# Patient Record
Sex: Male | Born: 1955 | Race: White | Hispanic: No | Marital: Single | Smoking: Current every day smoker
Health system: Southern US, Community
[De-identification: ages and names within clinical notes are randomized; demographics above are authoritative.]

## PROBLEM LIST (undated history)

## (undated) DIAGNOSIS — F329 Major depressive disorder, single episode, unspecified: Secondary | ICD-10-CM

## (undated) DIAGNOSIS — E119 Type 2 diabetes mellitus without complications: Secondary | ICD-10-CM

## (undated) DIAGNOSIS — F32A Depression, unspecified: Secondary | ICD-10-CM

## (undated) DIAGNOSIS — J449 Chronic obstructive pulmonary disease, unspecified: Secondary | ICD-10-CM

## (undated) DIAGNOSIS — J45909 Unspecified asthma, uncomplicated: Secondary | ICD-10-CM

## (undated) DIAGNOSIS — F419 Anxiety disorder, unspecified: Secondary | ICD-10-CM

## (undated) DIAGNOSIS — I10 Essential (primary) hypertension: Secondary | ICD-10-CM

## (undated) HISTORY — DX: Chronic obstructive pulmonary disease, unspecified: J44.9

## (undated) HISTORY — DX: Anxiety disorder, unspecified: F41.9

## (undated) HISTORY — DX: Type 2 diabetes mellitus without complications: E11.9

## (undated) HISTORY — DX: Unspecified asthma, uncomplicated: J45.909

## (undated) HISTORY — DX: Essential (primary) hypertension: I10

## (undated) HISTORY — DX: Depression, unspecified: F32.A

## (undated) HISTORY — PX: FRACTURE SURGERY: SHX138

---
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
## 1990-08-12 HISTORY — PX: CARDIAC CATHETERIZATION: SHX172

## 1998-03-03 ENCOUNTER — Ambulatory Visit (HOSPITAL_COMMUNITY): Admission: RE | Admit: 1998-03-03 | Discharge: 1998-03-03 | Payer: Self-pay | Admitting: *Deleted

## 2004-07-12 ENCOUNTER — Ambulatory Visit: Payer: Self-pay | Admitting: Internal Medicine

## 2004-07-14 ENCOUNTER — Inpatient Hospital Stay (HOSPITAL_COMMUNITY): Admission: AC | Admit: 2004-07-14 | Discharge: 2004-08-15 | Payer: Self-pay

## 2004-08-08 ENCOUNTER — Ambulatory Visit: Payer: Self-pay | Admitting: Physical Medicine & Rehabilitation

## 2004-08-15 ENCOUNTER — Inpatient Hospital Stay (HOSPITAL_COMMUNITY)
Admission: RE | Admit: 2004-08-15 | Discharge: 2004-08-27 | Payer: Self-pay | Admitting: Physical Medicine & Rehabilitation

## 2004-08-31 ENCOUNTER — Ambulatory Visit: Payer: Self-pay | Admitting: Infectious Diseases

## 2004-08-31 ENCOUNTER — Inpatient Hospital Stay (HOSPITAL_COMMUNITY): Admission: EM | Admit: 2004-08-31 | Discharge: 2004-09-03 | Payer: Self-pay | Admitting: Emergency Medicine

## 2004-10-02 ENCOUNTER — Emergency Department (HOSPITAL_COMMUNITY): Admission: EM | Admit: 2004-10-02 | Discharge: 2004-10-02 | Payer: Self-pay | Admitting: Emergency Medicine

## 2004-10-08 ENCOUNTER — Encounter
Admission: RE | Admit: 2004-10-08 | Discharge: 2005-01-06 | Payer: Self-pay | Admitting: Physical Medicine & Rehabilitation
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
## 2012-03-17 ENCOUNTER — Ambulatory Visit: Payer: Self-pay

## 2019-04-28 ENCOUNTER — Encounter: Payer: Self-pay | Admitting: Physician Assistant

## 2019-04-28 ENCOUNTER — Other Ambulatory Visit: Payer: Self-pay

## 2019-04-28 ENCOUNTER — Ambulatory Visit: Payer: Medicaid Other | Admitting: Physician Assistant

## 2019-04-28 VITALS — BP 120/84 | HR 92 | Temp 96.9°F | Resp 16 | Ht 66.0 in | Wt 201.4 lb

## 2019-04-28 DIAGNOSIS — E1169 Type 2 diabetes mellitus with other specified complication: Secondary | ICD-10-CM | POA: Diagnosis not present

## 2019-04-28 DIAGNOSIS — E119 Type 2 diabetes mellitus without complications: Secondary | ICD-10-CM | POA: Diagnosis not present

## 2019-04-28 DIAGNOSIS — F209 Schizophrenia, unspecified: Secondary | ICD-10-CM | POA: Diagnosis not present

## 2019-04-28 DIAGNOSIS — Z23 Encounter for immunization: Secondary | ICD-10-CM

## 2019-04-28 DIAGNOSIS — Z1211 Encounter for screening for malignant neoplasm of colon: Secondary | ICD-10-CM

## 2019-04-28 DIAGNOSIS — E785 Hyperlipidemia, unspecified: Secondary | ICD-10-CM

## 2019-04-28 DIAGNOSIS — I152 Hypertension secondary to endocrine disorders: Secondary | ICD-10-CM | POA: Insufficient documentation

## 2019-04-28 DIAGNOSIS — Z720 Tobacco use: Secondary | ICD-10-CM | POA: Diagnosis not present

## 2019-04-28 DIAGNOSIS — I10 Essential (primary) hypertension: Secondary | ICD-10-CM

## 2019-04-28 DIAGNOSIS — E1165 Type 2 diabetes mellitus with hyperglycemia: Secondary | ICD-10-CM | POA: Insufficient documentation

## 2019-04-28 DIAGNOSIS — E1159 Type 2 diabetes mellitus with other circulatory complications: Secondary | ICD-10-CM | POA: Insufficient documentation

## 2019-04-28 MED ORDER — METFORMIN HCL ER 500 MG PO TB24: 1000.0000 mg | ORAL_TABLET | Freq: Every day | ORAL | 0 refills | Status: DC

## 2019-04-28 MED ORDER — JANUMET XR 100-1000 MG PO TB24: 1.0000 | ORAL_TABLET | Freq: Every day | ORAL | 1 refills | Status: DC

## 2019-04-28 MED ORDER — METOPROLOL SUCCINATE 50 MG PO CS24: 1.0000 | EXTENDED_RELEASE_CAPSULE | Freq: Every day | ORAL | 1 refills | Status: DC
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
## 2019-04-28 NOTE — Patient Instructions (Signed)
Diabetes Mellitus and Exercise Exercising regularly is important for your overall health, especially when you have diabetes (diabetes mellitus). Exercising is not only about losing weight. It has many other health benefits, such as increasing muscle strength and bone density and reducing body fat and stress. This leads to improved fitness, flexibility, and endurance, all of which result in better overall health. Exercise has additional benefits for people with diabetes, including:  Reducing appetite.  Helping to lower and control blood glucose.  Lowering blood pressure.  Helping to control amounts of fatty substances (lipids) in the blood, such as cholesterol and triglycerides.  Helping the body to respond better to insulin (improving insulin sensitivity).  Reducing how much insulin the body needs.  Decreasing the risk for heart disease by: ? Lowering cholesterol and triglyceride levels. ? Increasing the levels of good cholesterol. ? Lowering blood glucose levels. What is my activity plan? Your health care provider or certified diabetes educator can help you make a plan for the type and frequency of exercise (activity plan) that works for you. Make sure that you:  Do at least 150 minutes of moderate-intensity or vigorous-intensity exercise each week. This could be brisk walking, biking, or water aerobics. ? Do stretching and strength exercises, such as yoga or weightlifting, at least 2 times a week. ? Spread out your activity over at least 3 days of the week.  Get some form of physical activity every day. ? Do not go more than 2 days in a row without some kind of physical activity. ? Avoid being inactive for more than 30 minutes at a time. Take frequent breaks to walk or stretch.  Choose a type of exercise or activity that you enjoy, and set realistic goals.  Start slowly, and gradually increase the intensity of your exercise over time. What do I need to know about managing my  diabetes?   Check your blood glucose before and after exercising. ? If your blood glucose is 240 mg/dL (13.3 mmol/L) or higher before you exercise, check your urine for ketones. If you have ketones in your urine, do not exercise until your blood glucose returns to normal. ? If your blood glucose is 100 mg/dL (5.6 mmol/L) or lower, eat a snack containing 15-20 grams of carbohydrate. Check your blood glucose 15 minutes after the snack to make sure that your level is above 100 mg/dL (5.6 mmol/L) before you start your exercise.  Know the symptoms of low blood glucose (hypoglycemia) and how to treat it. Your risk for hypoglycemia increases during and after exercise. Common symptoms of hypoglycemia can include: ? Hunger. ? Anxiety. ? Sweating and feeling clammy. ? Confusion. ? Dizziness or feeling light-headed. ? Increased heart rate or palpitations. ? Blurry vision. ? Tingling or numbness around the mouth, lips, or tongue. ? Tremors or shakes. ? Irritability.  Keep a rapid-acting carbohydrate snack available before, during, and after exercise to help prevent or treat hypoglycemia.  Avoid injecting insulin into areas of the body that are going to be exercised. For example, avoid injecting insulin into: ? The arms, when playing tennis. ? The legs, when jogging.  Keep records of your exercise habits. Doing this can help you and your health care provider adjust your diabetes management plan as needed. Write down: ? Food that you eat before and after you exercise. ? Blood glucose levels before and after you exercise. ? The type and amount of exercise you have done. ? When your insulin is expected to peak, if you use   insulin. Avoid exercising at times when your insulin is peaking.  When you start a new exercise or activity, work with your health care provider to make sure the activity is safe for you, and to adjust your insulin, medicines, or food intake as needed.  Drink plenty of water while  you exercise to prevent dehydration or heat stroke. Drink enough fluid to keep your urine clear or pale yellow. Summary  Exercising regularly is important for your overall health, especially when you have diabetes (diabetes mellitus).  Exercising has many health benefits, such as increasing muscle strength and bone density and reducing body fat and stress.  Your health care provider or certified diabetes educator can help you make a plan for the type and frequency of exercise (activity plan) that works for you.  When you start a new exercise or activity, work with your health care provider to make sure the activity is safe for you, and to adjust your insulin, medicines, or food intake as needed. This information is not intended to replace advice given to you by your health care provider. Make sure you discuss any questions you have with your health care provider. Document Released: 10/19/2003 Document Revised: 02/20/2017 Document Reviewed: 01/08/2016 Elsevier Patient Education  2020 Elsevier Inc.  

## 2019-04-29 ENCOUNTER — Telehealth: Payer: Self-pay

## 2019-04-29 ENCOUNTER — Telehealth: Payer: Self-pay | Admitting: *Deleted

## 2019-04-29 DIAGNOSIS — Z87891 Personal history of nicotine dependence: Secondary | ICD-10-CM

## 2019-04-29 DIAGNOSIS — Z122 Encounter for screening for malignant neoplasm of respiratory organs: Secondary | ICD-10-CM

## 2019-04-29 LAB — COMPREHENSIVE METABOLIC PANEL
ALT: 18 IU/L (ref 0–44)
AST: 20 IU/L (ref 0–40)
Albumin/Globulin Ratio: 1.5 (ref 1.2–2.2)
Albumin: 4.1 g/dL (ref 3.8–4.8)
Alkaline Phosphatase: 83 IU/L (ref 39–117)
BUN/Creatinine Ratio: 14 (ref 10–24)
BUN: 17 mg/dL (ref 8–27)
Bilirubin Total: 0.2 mg/dL (ref 0.0–1.2)
CO2: 22 mmol/L (ref 20–29)
Calcium: 9.5 mg/dL (ref 8.6–10.2)
Chloride: 101 mmol/L (ref 96–106)
Creatinine, Ser: 1.18 mg/dL (ref 0.76–1.27)
GFR calc Af Amer: 77 mL/min/{1.73_m2} (ref >59)
GFR calc non Af Amer: 66 mL/min/{1.73_m2} (ref >59)
Globulin, Total: 2.8 g/dL (ref 1.5–4.5)
Glucose: 110 mg/dL — ABNORMAL HIGH (ref 65–99)
Potassium: 5 mmol/L (ref 3.5–5.2)
Sodium: 137 mmol/L (ref 134–144)
Total Protein: 6.9 g/dL (ref 6.0–8.5)

## 2019-04-29 LAB — LIPID PANEL
Chol/HDL Ratio: 4.6 ratio (ref 0.0–5.0)
Cholesterol, Total: 138 mg/dL (ref 100–199)
HDL: 30 mg/dL — ABNORMAL LOW (ref >39)
LDL Chol Calc (NIH): 83 mg/dL (ref 0–99)
Triglycerides: 138 mg/dL (ref 0–149)
VLDL Cholesterol Cal: 25 mg/dL (ref 5–40)

## 2019-04-29 LAB — CBC WITH DIFFERENTIAL/PLATELET
Basophils Absolute: 0.1 10*3/uL (ref 0.0–0.2)
Basos: 1 %
EOS (ABSOLUTE): 0.1 10*3/uL (ref 0.0–0.4)
Eos: 2 %
Hematocrit: 44.7 % (ref 37.5–51.0)
Hemoglobin: 15.1 g/dL (ref 13.0–17.7)
Immature Grans (Abs): 0.1 10*3/uL (ref 0.0–0.1)
Immature Granulocytes: 1 %
Lymphocytes Absolute: 2.8 10*3/uL (ref 0.7–3.1)
Lymphs: 32 %
MCH: 30.9 pg (ref 26.6–33.0)
MCHC: 33.8 g/dL (ref 31.5–35.7)
MCV: 91 fL (ref 79–97)
Monocytes Absolute: 0.8 10*3/uL (ref 0.1–0.9)
Monocytes: 9 %
Neutrophils Absolute: 5 10*3/uL (ref 1.4–7.0)
Neutrophils: 55 %
Platelets: 284 10*3/uL (ref 150–450)
RBC: 4.89 x10E6/uL (ref 4.14–5.80)
RDW: 13.2 % (ref 11.6–15.4)
WBC: 8.8 10*3/uL (ref 3.4–10.8)

## 2019-04-29 LAB — MICROALBUMIN / CREATININE URINE RATIO
Creatinine, Urine: 103.3 mg/dL
Microalb/Creat Ratio: 7 mg/g creat (ref 0–29)
Microalbumin, Urine: 7.1 ug/mL

## 2019-04-29 LAB — HEMOGLOBIN A1C
Est. average glucose Bld gHb Est-mCnc: 160 mg/dL
Hgb A1c MFr Bld: 7.2 % — ABNORMAL HIGH (ref 4.8–5.6)

## 2019-04-29 NOTE — Telephone Encounter (Signed)
Received referral for initial lung cancer screening scan. Contacted patient and obtained smoking history,(current, 92 pack year) as well as answering questions related to screening process. Patient denies signs of lung cancer such as weight loss or hemoptysis. Patient denies comorbidity that would prevent curative treatment if lung cancer were found. Patient is scheduled for shared decision making visit and CT scan on 05/13/19 at 130pm.

## 2019-04-29 NOTE — Telephone Encounter (Signed)
Mrs. Luis Woods aware of lab results.

## 2019-04-29 NOTE — Telephone Encounter (Signed)
-----   Message from Trinna Post, [place] sent at 04/29/2019  1:23 PM EDT ----- A1c at 7.2 is just a touch high. Please watch sugar intake and we will check next visit before adjusting medications. CBC normal. Cholesterol just slightly above where it should be for somebody with diabetes. We can recheck next time as well. Please call brother ron or sister judy with results, they should be on DPR.

## 2019-05-12 ENCOUNTER — Other Ambulatory Visit: Payer: Self-pay | Admitting: Physician Assistant

## 2019-05-12 DIAGNOSIS — F259 Schizoaffective disorder, unspecified: Secondary | ICD-10-CM | POA: Insufficient documentation

## 2019-05-12 NOTE — Progress Notes (Signed)
Records received

## 2019-05-13 ENCOUNTER — Ambulatory Visit: Payer: Medicaid Other

## 2019-05-13 ENCOUNTER — Ambulatory Visit: Payer: Medicaid Other | Admitting: Oncology

## 2019-05-20 ENCOUNTER — Other Ambulatory Visit: Payer: Self-pay | Admitting: Physician Assistant

## 2019-05-26 ENCOUNTER — Ambulatory Visit: Payer: Medicaid Other

## 2019-05-27 ENCOUNTER — Ambulatory Visit: Payer: Medicaid Other

## 2019-05-31 ENCOUNTER — Encounter: Payer: Self-pay | Admitting: Nurse Practitioner

## 2019-06-01 ENCOUNTER — Other Ambulatory Visit: Payer: Self-pay

## 2019-06-01 ENCOUNTER — Inpatient Hospital Stay: Payer: Medicaid Other | Attending: Nurse Practitioner | Admitting: Oncology

## 2019-06-01 ENCOUNTER — Ambulatory Visit
Admission: RE | Admit: 2019-06-01 | Discharge: 2019-06-01 | Disposition: A | Discharge status: Home / Self Care | Payer: Medicaid Other | Source: Ambulatory Visit | Attending: Oncology | Admitting: Oncology

## 2019-06-01 DIAGNOSIS — Z87891 Personal history of nicotine dependence: Secondary | ICD-10-CM | POA: Diagnosis not present

## 2019-06-01 DIAGNOSIS — Z122 Encounter for screening for malignant neoplasm of respiratory organs: Secondary | ICD-10-CM | POA: Diagnosis not present

## 2019-06-01 NOTE — Progress Notes (Signed)
Virtual Visit via Video Note  I connected with Luis Woods on 06/01/19 at  1:15 PM EDT by a video enabled telemedicine application and verified that I am speaking with the correct person using two identifiers.  Location: Patient: [facility] Provider: Office   I discussed the limitations of evaluation and management by telemedicine and the availability of in person appointments. The patient expressed understanding and agreed to proceed.  I discussed the assessment and treatment plan with the patient. The patient was provided an opportunity to ask questions and all were answered. The patient agreed with the plan and demonstrated an understanding of the instructions.   The patient was advised to call back or seek an in-person evaluation if the symptoms worsen or if the condition fails to improve as anticipated.   In accordance with CMS guidelines, patient has met eligibility criteria including age, absence of signs or symptoms of lung cancer.  Social History   Tobacco Use  . Smoking status: Current Every Day Smoker    Packs/day: 2.00    Years: 46.00    Pack years: 92.00    Types: Cigarettes  . Smokeless tobacco: Never Used  Substance Use Topics  . Alcohol use: Not on file  . Drug use: Not on file      A shared decision-making session was conducted prior to the performance of CT scan. This includes one or more decision aids, includes benefits and harms of screening, follow-up diagnostic testing, over-diagnosis, false positive rate, and total radiation exposure.   Counseling on the importance of adherence to annual lung cancer LDCT screening, impact of co-morbidities, and ability or willingness to undergo diagnosis and treatment is imperative for compliance of the program.   Counseling on the importance of continued smoking cessation for former smokers; the importance of smoking cessation for current smokers, and information about tobacco cessation interventions have been given to patient  including [place] and [address] quit [place] programs.   Written order for lung cancer screening with LDCT has been given to the patient and any and all questions have been answered to the best of my abilities.    Yearly follow up will be coordinated by Burgess Estelle, Thoracic Navigator.  I provided 15 minutes of face-to-face video visit time during this encounter, and > 50% was spent counseling as documented under my assessment & plan.   Jacquelin Hawking, NP

## 2019-06-03 ENCOUNTER — Encounter: Payer: Self-pay | Admitting: *Deleted

## 2019-06-03 ENCOUNTER — Other Ambulatory Visit: Payer: Self-pay | Admitting: Physician Assistant

## 2019-06-03 DIAGNOSIS — J439 Emphysema, unspecified: Secondary | ICD-10-CM

## 2019-06-03 NOTE — Progress Notes (Signed)
Emphysema diagnosed on low dose lung cancer screening.

## 2019-06-10 ENCOUNTER — Telehealth: Payer: Self-pay

## 2019-06-10 NOTE — Telephone Encounter (Signed)
A letter was sent out on 06/03/2019 explaining findings in great detail. Scan was not concerning for lung cancer. Hardening of arteries - patient is already treated with this with statin. Emphysema or COPD due to chronic smoking. This will get worse over time with smoking and the best option is to quit smoking.

## 2019-06-10 NOTE — Telephone Encounter (Signed)
Patient's sister Luis Woods called upset because they have not received a call back with CT results. She would like a call back today if possible.

## 2019-06-11 NOTE — Telephone Encounter (Signed)
Patient's sister [name]) was advised per DRP.

## 2019-06-29 LAB — HM DIABETES EYE EXAM

## 2019-07-14 ENCOUNTER — Other Ambulatory Visit: Payer: Self-pay | Admitting: Physician Assistant

## 2019-07-28 ENCOUNTER — Ambulatory Visit: Payer: Self-pay | Admitting: Physician Assistant

## 2019-08-12 ENCOUNTER — Other Ambulatory Visit: Payer: Self-pay | Admitting: Physician Assistant

## 2019-08-12 DIAGNOSIS — E119 Type 2 diabetes mellitus without complications: Secondary | ICD-10-CM

## 2019-08-17 ENCOUNTER — Other Ambulatory Visit: Payer: Self-pay | Admitting: Physician Assistant

## 2019-09-14 ENCOUNTER — Other Ambulatory Visit: Payer: Self-pay | Admitting: Physician Assistant

## 2019-09-14 NOTE — Telephone Encounter (Signed)
Requested medication (s) are due for refill today: yes  Requested medication (s) are on the active medication list: yes  Last refill:  08/17/2019  Future visit scheduled: yes  Notes to clinic:  Cardiovascular: Antilipid- statins failed  Requested Prescriptions  Pending Prescriptions Disp Refills   lovastatin (MEVACOR) 40 MG tablet [Pharmacy Med Name: LOVASTATIN 40 MG TAB] 30 tablet 0    Sig: TAKE 1 TABLET BY MOUTH EVERY NIGHT AT BEDTIME      Cardiovascular:  Antilipid - Statins Failed - 09/14/2019  4:23 PM      Failed - LDL in normal range and within 360 days    LDL Chol Calc (NIH)  Date Value Ref Range Status  04/28/2019 83 0 - 99 mg/dL Final          Failed - HDL in normal range and within 360 days    HDL  Date Value Ref Range Status  04/28/2019 30 (L) >39 mg/dL Final          Passed - Total Cholesterol in normal range and within 360 days    Cholesterol, Total  Date Value Ref Range Status  04/28/2019 138 100 - 199 mg/dL Final          Passed - Triglycerides in normal range and within 360 days    Triglycerides  Date Value Ref Range Status  04/28/2019 138 0 - 149 mg/dL Final          Passed - Patient is not pregnant      Passed - Valid encounter within last 12 months    Recent Outpatient Visits           4 months ago Type 2 diabetes mellitus without complication, unspecified whether long term insulin use [facility])   [facility] Jones, Lavella Hammock, PA-C       Future Appointments             In 2 weeks Trey Sailors, PA-C [facility], [facility]

## 2019-09-28 ENCOUNTER — Ambulatory Visit: Payer: Medicaid Other | Admitting: Physician Assistant

## 2019-09-28 ENCOUNTER — Encounter: Payer: Self-pay | Admitting: Physician Assistant

## 2019-09-28 ENCOUNTER — Other Ambulatory Visit: Payer: Self-pay

## 2019-09-28 VITALS — BP 130/90 | HR 86 | Temp 96.2°F | Ht 66.0 in | Wt 202.0 lb

## 2019-09-28 DIAGNOSIS — J439 Emphysema, unspecified: Secondary | ICD-10-CM

## 2019-09-28 DIAGNOSIS — I152 Hypertension secondary to endocrine disorders: Secondary | ICD-10-CM

## 2019-09-28 DIAGNOSIS — E1159 Type 2 diabetes mellitus with other circulatory complications: Secondary | ICD-10-CM | POA: Diagnosis not present

## 2019-09-28 DIAGNOSIS — E119 Type 2 diabetes mellitus without complications: Secondary | ICD-10-CM | POA: Diagnosis not present

## 2019-09-28 DIAGNOSIS — F209 Schizophrenia, unspecified: Secondary | ICD-10-CM

## 2019-09-28 DIAGNOSIS — Z1211 Encounter for screening for malignant neoplasm of colon: Secondary | ICD-10-CM | POA: Diagnosis not present

## 2019-09-28 DIAGNOSIS — E785 Hyperlipidemia, unspecified: Secondary | ICD-10-CM | POA: Diagnosis not present

## 2019-09-28 DIAGNOSIS — I10 Essential (primary) hypertension: Secondary | ICD-10-CM

## 2019-09-28 LAB — POCT GLYCOSYLATED HEMOGLOBIN (HGB A1C)
Est. average glucose Bld gHb Est-mCnc: 151
Hemoglobin A1C: 6.9 % — AB (ref 4.0–5.6)

## 2019-09-28 MED ORDER — ALBUTEROL SULFATE HFA 108 (90 BASE) MCG/ACT IN AERS: 2.0000 | INHALATION_SPRAY | Freq: Four times a day (QID) | RESPIRATORY_TRACT | 0 refills | Status: DC | PRN

## 2019-09-28 NOTE — Progress Notes (Signed)
Patient: Luis Woods Male    DOB: Dec 21, 1955   64 y.o.   MRN: [id number] Visit Date: 09/28/2019  Today's Provider: Trey Sailors, PA-C   Chief Complaint  Patient presents with  . Diabetes  . Hypertension  . Hyperlipidemia   Subjective:     HPI  Diabetes Mellitus Type II, Follow-up:   Lab Results  Component Value Date   HGBA1C 7.2 (H) 04/28/2019   Last seen for diabetes 5 months ago.   Management since then includes JANUMET XR [phone] MG Take 1 tablet by mouth daily.   - metformin 500 MG Take 2 tablets (1,000 mg total) by mouth daily with breakfast.   He reports good compliance with treatment. He is not having side effects.  Current symptoms include none and have been stable. Home blood sugar records: not checking at home  Episodes of hypoglycemia? no   Current Insulin Regimen: none Most Recent Eye Exam: UTD Weight trend: stable Prior visit with dietician: no Current diet: in general, a "healthy" diet   Current exercise: walking  ------------------------------------------------------------------------   Hypertension, follow-up:  BP Readings from Last 3 Encounters:  09/28/19 130/90  04/28/19 120/84    He was last seen for hypertension 5 months ago.  BP at that visit was 120/84 in right arm. Management since that visit includes  Metoprolol Succinate 50 MG CS24; Take 1 capsule by mouth daily.  Marland KitchenHe reports good compliance with treatment. He is not having side effects.  He is exercising. He is not adherent to low salt diet.   Outside blood pressures are not. He is experiencing none.  Patient denies none.   Cardiovascular risk factors include advanced age (older than 16 for men, 11 for women), diabetes mellitus, hypertension and male gender.  Use of agents associated with hypertension: none.   ------------------------------------------------------------------------    Lipid/Cholesterol, Follow-up:   Last seen for this 5 months ago.   Management since that visit includes Continue Statin.  Last Lipid Panel:    Component Value Date/Time   CHOL 138 04/28/2019 1503   TRIG 138 04/28/2019 1503   HDL 30 (L) 04/28/2019 1503   CHOLHDL 4.6 04/28/2019 1503   LDLCALC 83 04/28/2019 1503    He reports good compliance with treatment. He is not having side effects. None.  Wt Readings from Last 3 Encounters:  09/28/19 202 lb (91.6 kg)  06/01/19 201 lb (91.2 kg)  04/28/19 201 lb 6.4 oz (91.4 kg)   Emphysema: Found on CT lung cancer screening. He continues to smoke and has done so for 45 years. He has a history of occasional wheezing but no issues on a daily basis.   Patient had cologuard ordered last time but reports it did not come to the house.  ------------------------------------------------------------------------  Allergies  Allergen Reactions  . Sulfa Antibiotics Itching  . Klonopin [Clonazepam] Rash     Current Outpatient Medications:  .  IMIPRAMINE HCL PO, Take 1 capsule by mouth 3 (three) times daily., Disp: , Rfl:  .  lovastatin (MEVACOR) 40 MG tablet, TAKE 1 TABLET BY MOUTH EVERY NIGHT AT BEDTIME, Disp: 30 tablet, Rfl: 0 .  metFORMIN (GLUCOPHAGE-XR) 500 MG 24 hr tablet, TAKE 2 TABLETS BY MOUTH DAILY WITH BREAKFAST, Disp: 180 tablet, Rfl: 0 .  Metoprolol Succinate 50 MG CS24, Take 1 capsule by mouth daily., Disp: 90 capsule, Rfl: 1 .  perphenazine (TRILAFON) 2 MG tablet, Take 2 mg by mouth 2 (two) times daily., Disp: , Rfl:  .  SitaGLIPtin-MetFORMIN HCl (JANUMET XR) [phone] MG TB24, Take 1 tablet by mouth daily., Disp: 90 tablet, Rfl: 1  Review of Systems  Social History   Tobacco Use  . Smoking status: Current Every Day Smoker    Packs/day: 2.00    Years: 46.00    Pack years: 92.00    Types: Cigarettes  . Smokeless tobacco: Never Used  Substance Use Topics  . Alcohol use: Not on file      Objective:   BP 130/90 (BP Location: Right Arm, Patient Position: Sitting, Cuff Size: Normal)   Pulse 86    Temp (!) 96.2 F (35.7 C)   Ht 5\' 6"  (1.676 m)   Wt 202 lb (91.6 kg)   SpO2 98%   BMI 32.60 kg/m  Vitals:   09/28/19 1048  BP: 130/90  Pulse: 86  Temp: (!) 96.2 F (35.7 C)  SpO2: 98%  Weight: 202 lb (91.6 kg)  Height: 5\' 6"  (1.676 m)  Body mass index is 32.6 kg/m.   Physical Exam Constitutional:      Appearance: Normal appearance.  Cardiovascular:     Rate and Rhythm: Normal rate and regular rhythm.     Heart sounds: Normal heart sounds.  Pulmonary:     Effort: Pulmonary effort is normal.     Breath sounds: Normal breath sounds.  Skin:    General: Skin is warm and dry.  Neurological:     Mental Status: He is alert and oriented to person, place, and time. Mental status is at baseline.  Psychiatric:        Mood and Affect: Mood normal.        Behavior: Behavior normal.      No results found for any visits on 09/28/19.     Assessment & Plan    1. Type 2 diabetes mellitus without complication, unspecified whether long term insulin use ([place])  Follow up in 6 months. Call brother in law to schedule appointment.   - POCT glycosylated hemoglobin (Hb A1C)  2. Colon cancer screening  - Cologuard  3. Hypertension associated with diabetes ([place])  Continue current medications.  4. Hyperlipidemia, unspecified hyperlipidemia type  Continue current medications.  5. Pulmonary emphysema, unspecified emphysema type ([place])  Please stop smoking.   - albuterol (VENTOLIN HFA) 108 (90 Base) MCG/ACT inhaler; Inhale 2 puffs into the lungs every 6 (six) hours as needed for wheezing or shortness of breath.  Dispense: 6.7 g; Refill: 0  The entirety of the information documented in the History of Present Illness, Review of Systems and Physical Exam were personally obtained by me. Portions of this information were initially documented by [facility] and reviewed by me for thoroughness and accuracy.      Trinna Post, PA-C  [facility]

## 2019-10-07 ENCOUNTER — Other Ambulatory Visit: Payer: Self-pay | Admitting: Family Medicine

## 2019-10-07 NOTE — Telephone Encounter (Signed)
Requested medication (s) are due for refill today: yes Requested medication (s) are on the active medication list: yes  Last refill:  09/14/19  Future visit scheduled: no  Notes to clinic:  cardiovascular: antilipid - statins failed  Requested Prescriptions  Pending Prescriptions Disp Refills   lovastatin (MEVACOR) 40 MG tablet [Pharmacy Med Name: LOVASTATIN 40 MG TAB] 30 tablet 0    Sig: TAKE 1 TABLET BY MOUTH EVERY NIGHT AT BEDTIME      Cardiovascular:  Antilipid - Statins Failed - 10/07/2019  4:16 PM      Failed - LDL in normal range and within 360 days    LDL Chol Calc (NIH)  Date Value Ref Range Status  04/28/2019 83 0 - 99 mg/dL Final          Failed - HDL in normal range and within 360 days    HDL  Date Value Ref Range Status  04/28/2019 30 (L) >39 mg/dL Final          Passed - Total Cholesterol in normal range and within 360 days    Cholesterol, Total  Date Value Ref Range Status  04/28/2019 138 100 - 199 mg/dL Final          Passed - Triglycerides in normal range and within 360 days    Triglycerides  Date Value Ref Range Status  04/28/2019 138 0 - 149 mg/dL Final          Passed - Patient is not pregnant      Passed - Valid encounter within last 12 months    Recent Outpatient Visits           1 week ago Type 2 diabetes mellitus without complication, unspecified whether long term insulin use [facility])   [facility] [place], Adriana M, PA-C   5 months ago Type 2 diabetes mellitus without complication, unspecified whether long term insulin use [facility])   [facility] [place], [place], [place]

## 2019-11-02 ENCOUNTER — Other Ambulatory Visit: Payer: Self-pay | Admitting: Physician Assistant

## 2019-11-02 NOTE — Telephone Encounter (Signed)
Requested Prescriptions  Pending Prescriptions Disp Refills  . lovastatin (MEVACOR) 40 MG tablet [Pharmacy Med Name: LOVASTATIN 40 MG TAB] 30 tablet 0    Sig: TAKE 1 TABLET BY MOUTH EVERY NIGHT AT BEDTIME     Cardiovascular:  Antilipid - Statins Failed - 11/02/2019 12:01 PM      Failed - LDL in normal range and within 360 days    LDL Chol Calc (NIH)  Date Value Ref Range Status  04/28/2019 83 0 - 99 mg/dL Final         Failed - HDL in normal range and within 360 days    HDL  Date Value Ref Range Status  04/28/2019 30 (L) >39 mg/dL Final         Passed - Total Cholesterol in normal range and within 360 days    Cholesterol, Total  Date Value Ref Range Status  04/28/2019 138 100 - 199 mg/dL Final         Passed - Triglycerides in normal range and within 360 days    Triglycerides  Date Value Ref Range Status  04/28/2019 138 0 - 149 mg/dL Final         Passed - Patient is not pregnant      Passed - Valid encounter within last 12 months    Recent Outpatient Visits          1 month ago Type 2 diabetes mellitus without complication, unspecified whether long term insulin use [facility])   [facility] [place], Adriana M, PA-C   6 months ago Type 2 diabetes mellitus without complication, unspecified whether long term insulin use [facility])   [facility] [place], [place], [place]

## 2019-11-12 ENCOUNTER — Other Ambulatory Visit: Payer: Self-pay | Admitting: Physician Assistant

## 2019-11-12 DIAGNOSIS — E119 Type 2 diabetes mellitus without complications: Secondary | ICD-10-CM

## 2019-12-01 ENCOUNTER — Other Ambulatory Visit: Payer: Self-pay | Admitting: Physician Assistant

## 2020-02-11 ENCOUNTER — Other Ambulatory Visit: Payer: Self-pay | Admitting: Physician Assistant

## 2020-02-11 DIAGNOSIS — E119 Type 2 diabetes mellitus without complications: Secondary | ICD-10-CM

## 2020-04-11 ENCOUNTER — Other Ambulatory Visit: Payer: Self-pay | Admitting: Physician Assistant

## 2020-05-11 ENCOUNTER — Other Ambulatory Visit: Payer: Self-pay | Admitting: Physician Assistant

## 2020-05-11 DIAGNOSIS — E119 Type 2 diabetes mellitus without complications: Secondary | ICD-10-CM

## 2020-05-11 NOTE — Telephone Encounter (Signed)
Courtesy refill. Left message to call and make appointment. °

## 2020-05-18 ENCOUNTER — Encounter: Payer: Self-pay | Admitting: Physician Assistant

## 2020-05-18 ENCOUNTER — Ambulatory Visit (INDEPENDENT_AMBULATORY_CARE_PROVIDER_SITE_OTHER): Payer: Medicaid Other | Admitting: Physician Assistant

## 2020-05-18 ENCOUNTER — Other Ambulatory Visit: Payer: Self-pay

## 2020-05-18 ENCOUNTER — Other Ambulatory Visit: Payer: Self-pay | Admitting: Physician Assistant

## 2020-05-18 VITALS — BP 122/90 | HR 87 | Temp 98.6°F | Resp 16 | Ht 66.0 in | Wt 200.0 lb

## 2020-05-18 DIAGNOSIS — E119 Type 2 diabetes mellitus without complications: Secondary | ICD-10-CM

## 2020-05-18 DIAGNOSIS — Z114 Encounter for screening for human immunodeficiency virus [HIV]: Secondary | ICD-10-CM

## 2020-05-18 DIAGNOSIS — Z0000 Encounter for general adult medical examination without abnormal findings: Secondary | ICD-10-CM

## 2020-05-18 DIAGNOSIS — Z23 Encounter for immunization: Secondary | ICD-10-CM

## 2020-05-18 DIAGNOSIS — Z125 Encounter for screening for malignant neoplasm of prostate: Secondary | ICD-10-CM

## 2020-05-18 DIAGNOSIS — I152 Hypertension secondary to endocrine disorders: Secondary | ICD-10-CM

## 2020-05-18 DIAGNOSIS — Z1159 Encounter for screening for other viral diseases: Secondary | ICD-10-CM

## 2020-05-18 DIAGNOSIS — Z1211 Encounter for screening for malignant neoplasm of colon: Secondary | ICD-10-CM

## 2020-05-18 DIAGNOSIS — E1159 Type 2 diabetes mellitus with other circulatory complications: Secondary | ICD-10-CM

## 2020-05-18 MED ORDER — LOVASTATIN 40 MG PO TABS: 40.0000 mg | ORAL_TABLET | Freq: Every day | ORAL | 1 refills | Status: DC

## 2020-05-18 MED ORDER — METFORMIN HCL ER 500 MG PO TB24: ORAL_TABLET | ORAL | 1 refills | Status: DC

## 2020-05-18 MED ORDER — METOPROLOL SUCCINATE 50 MG PO CS24: 1.0000 | EXTENDED_RELEASE_CAPSULE | Freq: Every day | ORAL | 1 refills | Status: DC

## 2020-05-18 MED ORDER — JANUMET XR 100-1000 MG PO TB24: 1.0000 | ORAL_TABLET | Freq: Every day | ORAL | 1 refills | Status: DC

## 2020-05-18 NOTE — Patient Instructions (Signed)
Please complete the cologuard when it comes to your house.   We checked your blood today.
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
## 2020-05-18 NOTE — Addendum Note (Signed)
Addended by: Anson Oregon on: 05/18/2020 01:51 PM   Modules accepted: Orders

## 2020-05-19 LAB — CBC WITH DIFFERENTIAL/PLATELET
Basophils Absolute: 0.1 10*3/uL (ref 0.0–0.2)
Basos: 1 %
EOS (ABSOLUTE): 0.1 10*3/uL (ref 0.0–0.4)
Eos: 1 %
Hematocrit: 45.4 % (ref 37.5–51.0)
Hemoglobin: 15.1 g/dL (ref 13.0–17.7)
Immature Grans (Abs): 0.1 10*3/uL (ref 0.0–0.1)
Immature Granulocytes: 1 %
Lymphocytes Absolute: 2.3 10*3/uL (ref 0.7–3.1)
Lymphs: 30 %
MCH: 30.6 pg (ref 26.6–33.0)
MCHC: 33.3 g/dL (ref 31.5–35.7)
MCV: 92 fL (ref 79–97)
Monocytes Absolute: 0.7 10*3/uL (ref 0.1–0.9)
Monocytes: 10 %
Neutrophils Absolute: 4.5 10*3/uL (ref 1.4–7.0)
Neutrophils: 57 %
Platelets: 267 10*3/uL (ref 150–450)
RBC: 4.94 x10E6/uL (ref 4.14–5.80)
RDW: 12.8 % (ref 11.6–15.4)
WBC: 7.7 10*3/uL (ref 3.4–10.8)

## 2020-05-19 LAB — COMPREHENSIVE METABOLIC PANEL
ALT: 17 IU/L (ref 0–44)
AST: 14 IU/L (ref 0–40)
Albumin/Globulin Ratio: 1.7 (ref 1.2–2.2)
Albumin: 4.3 g/dL (ref 3.8–4.8)
Alkaline Phosphatase: 87 IU/L (ref 44–121)
BUN/Creatinine Ratio: 14 (ref 10–24)
BUN: 16 mg/dL (ref 8–27)
Bilirubin Total: 0.2 mg/dL (ref 0.0–1.2)
CO2: 22 mmol/L (ref 20–29)
Calcium: 9.6 mg/dL (ref 8.6–10.2)
Chloride: 99 mmol/L (ref 96–106)
Creatinine, Ser: 1.11 mg/dL (ref 0.76–1.27)
GFR calc Af Amer: 82 mL/min/{1.73_m2} (ref >59)
GFR calc non Af Amer: 71 mL/min/{1.73_m2} (ref >59)
Globulin, Total: 2.5 g/dL (ref 1.5–4.5)
Glucose: 115 mg/dL — ABNORMAL HIGH (ref 65–99)
Potassium: 4.6 mmol/L (ref 3.5–5.2)
Sodium: 137 mmol/L (ref 134–144)
Total Protein: 6.8 g/dL (ref 6.0–8.5)

## 2020-05-19 LAB — LIPID PANEL
Chol/HDL Ratio: 4.6 ratio (ref 0.0–5.0)
Cholesterol, Total: 148 mg/dL (ref 100–199)
HDL: 32 mg/dL — ABNORMAL LOW (ref >39)
LDL Chol Calc (NIH): 92 mg/dL (ref 0–99)
Triglycerides: 135 mg/dL (ref 0–149)
VLDL Cholesterol Cal: 24 mg/dL (ref 5–40)

## 2020-05-19 LAB — HEMOGLOBIN A1C
Est. average glucose Bld gHb Est-mCnc: 160 mg/dL
Hgb A1c MFr Bld: 7.2 % — ABNORMAL HIGH (ref 4.8–5.6)

## 2020-05-19 LAB — MICROALBUMIN / CREATININE URINE RATIO
Creatinine, Urine: 103.5 mg/dL
Microalb/Creat Ratio: 13 mg/g creat (ref 0–29)
Microalbumin, Urine: 13.7 ug/mL

## 2020-05-19 LAB — TSH: TSH: 2.52 u[IU]/mL (ref 0.450–4.500)

## 2020-05-19 LAB — PSA: Prostate Specific Ag, Serum: 5.5 ng/mL — ABNORMAL HIGH (ref 0.0–4.0)

## 2020-05-19 LAB — HIV ANTIBODY (ROUTINE TESTING W REFLEX): HIV Screen 4th Generation wRfx: NONREACTIVE

## 2020-05-19 LAB — HEPATITIS C ANTIBODY: Hep C Virus Ab: <0.1 s/co ratio (ref 0.0–0.9)

## 2020-05-25 ENCOUNTER — Telehealth: Payer: Self-pay | Admitting: *Deleted

## 2020-05-25 NOTE — Telephone Encounter (Signed)
Attempted to contact and schedule lung screening scan. Message left for patient to call back to schedule. 

## 2020-06-13 NOTE — Telephone Encounter (Signed)
Attempted to contact and schedule lung screening scan. Message left for patient to call back to schedule. 

## 2020-07-04 ENCOUNTER — Telehealth: Payer: Self-pay | Admitting: *Deleted

## 2020-07-04 NOTE — Telephone Encounter (Signed)
Attempted to contact and schedule lung screening scan. Message left for patient to call back to schedule. 

## 2020-08-16 ENCOUNTER — Encounter: Payer: Self-pay | Admitting: *Deleted

## 2020-11-07 ENCOUNTER — Other Ambulatory Visit: Payer: Self-pay | Admitting: Physician Assistant

## 2020-11-07 DIAGNOSIS — E119 Type 2 diabetes mellitus without complications: Secondary | ICD-10-CM

## 2020-11-16 ENCOUNTER — Ambulatory Visit: Payer: Self-pay | Admitting: Physician Assistant

## 2020-11-20 ENCOUNTER — Ambulatory Visit: Payer: Self-pay | Admitting: Family Medicine

## 2020-11-28 ENCOUNTER — Ambulatory Visit: Payer: Self-pay | Admitting: Family Medicine

## 2020-11-28 ENCOUNTER — Ambulatory Visit: Payer: Medicaid Other | Admitting: Family Medicine

## 2020-11-28 ENCOUNTER — Other Ambulatory Visit: Payer: Self-pay

## 2020-11-28 ENCOUNTER — Encounter: Payer: Self-pay | Admitting: Family Medicine

## 2020-11-28 VITALS — BP 126/91 | HR 84 | Temp 98.0°F | Resp 16 | Ht 66.0 in | Wt 198.0 lb

## 2020-11-28 DIAGNOSIS — E1159 Type 2 diabetes mellitus with other circulatory complications: Secondary | ICD-10-CM

## 2020-11-28 DIAGNOSIS — E119 Type 2 diabetes mellitus without complications: Secondary | ICD-10-CM | POA: Diagnosis not present

## 2020-11-28 DIAGNOSIS — R972 Elevated prostate specific antigen [PSA]: Secondary | ICD-10-CM | POA: Diagnosis not present

## 2020-11-28 DIAGNOSIS — E785 Hyperlipidemia, unspecified: Secondary | ICD-10-CM | POA: Diagnosis not present

## 2020-11-28 DIAGNOSIS — J439 Emphysema, unspecified: Secondary | ICD-10-CM | POA: Diagnosis not present

## 2020-11-28 DIAGNOSIS — I152 Hypertension secondary to endocrine disorders: Secondary | ICD-10-CM

## 2020-11-28 DIAGNOSIS — F259 Schizoaffective disorder, unspecified: Secondary | ICD-10-CM

## 2020-11-28 DIAGNOSIS — F209 Schizophrenia, unspecified: Secondary | ICD-10-CM

## 2020-11-28 LAB — POCT GLYCOSYLATED HEMOGLOBIN (HGB A1C): Hemoglobin A1C: 7 % — AB (ref 4.0–5.6)

## 2020-11-28 NOTE — Progress Notes (Signed)
Established patient visit   Patient: Luis Woods   DOB: [birth year]   65 y.o. Male  MRN: [id number] Visit Date: 11/28/2020  Today's healthcare provider: Megan Mans, MD   Chief Complaint  Patient presents with  . Diabetes  . Hyperlipidemia  . Hypertension   Subjective    HPI   Patient comes in today for follow-up.  He feels pretty well but request Spiriva for his COPD which is helped.  He is a 2 and half pack per day smoker and has for 45 years. He states his diabetes is fairly well controlled as far as he can tell. Diabetes Mellitus Type II, follow-up  Lab Results  Component Value Date   HGBA1C 7.0 (A) 11/28/2020   HGBA1C 7.2 (H) 05/18/2020   HGBA1C 6.9 (A) 09/28/2019   Last seen for diabetes 6 months ago.  Management since then includes continuing the same treatment. He reports good compliance with treatment. He is not having side effects.    Home blood sugar records: not being checked  Episodes of hypoglycemia? No    Current insulin regiment: none Most Recent Eye Exam: due  Hypertension, follow-up  BP Readings from Last 3 Encounters:  11/28/20 (!) 126/91  05/18/20 122/90  09/28/19 130/90   Wt Readings from Last 3 Encounters:  11/28/20 198 lb (89.8 kg)  05/18/20 200 lb (90.7 kg)  09/28/19 202 lb (91.6 kg)     He was last seen for hypertension 6 months ago.  BP at that visit was 122/90. Management since that visit includes no medication changes. He reports good compliance with treatment. He is not having side effects.  He is not exercising. He is adherent to low salt diet.   Outside blood pressures are not being checked.  He does smoke.  Use of agents associated with hypertension: none.   --------------------------------------------------------------------------------------------------- Lipid/Cholesterol, follow-up  Last Lipid Panel: Lab Results  Component Value Date   CHOL 148 05/18/2020   LDLCALC 92 05/18/2020   HDL 32 (L)  05/18/2020   TRIG 135 05/18/2020    He was last seen for this 6 months ago.  Management since that visit includes no changes.  He reports good compliance with treatment. He is not having side effects.   Symptoms: No appetite changes No foot ulcerations  No chest pain No chest pressure/discomfort  No dyspnea No orthopnea  No fatigue No lower extremity edema  No palpitations No paroxysmal nocturnal dyspnea  No nausea No numbness or tingling of extremity  No polydipsia No polyuria  No speech difficulty No syncope   He is following a Regular diet. Current exercise: no regular exercise  Last metabolic panel Lab Results  Component Value Date   GLUCOSE 115 (H) 05/18/2020   NA 137 05/18/2020   K 4.6 05/18/2020   BUN 16 05/18/2020   CREATININE 1.11 05/18/2020   GFRNONAA 71 05/18/2020   GFRAA 82 05/18/2020   CALCIUM 9.6 05/18/2020   AST 14 05/18/2020   ALT 17 05/18/2020   The 10-year ASCVD risk score Denman George DC Jr., et al., 2013) is: 28.9%       Medications: Outpatient Medications Prior to Visit  Medication Sig  . albuterol (VENTOLIN HFA) 108 (90 Base) MCG/ACT inhaler Inhale 2 puffs into the lungs every 6 (six) hours as needed for wheezing or shortness of breath.  . IMIPRAMINE HCL PO Take 1 capsule by mouth 3 (three) times daily.  Marland Kitchen JANUMET XR [phone] MG TB24 TAKE 1 TABLET BY  MOUTH ONCE A DAY  . lovastatin (MEVACOR) 40 MG tablet Take 1 tablet (40 mg total) by mouth at bedtime.  . metFORMIN (GLUCOPHAGE-XR) 500 MG 24 hr tablet TAKE 2 TABLET BY MOUTH DAILY WITH BREAKFAST  . Metoprolol Succinate 50 MG CS24 Take 1 capsule by mouth daily.  Marland Kitchen perphenazine (TRILAFON) 2 MG tablet Take 2 mg by mouth 2 (two) times daily.   No facility-administered medications prior to visit.    Review of Systems  Constitutional: Negative.   Respiratory: Negative for cough and shortness of breath.   Cardiovascular: Negative for chest pain, palpitations and leg swelling.  Endocrine: Negative for  cold intolerance, heat intolerance, polydipsia, polyphagia and polyuria.  Musculoskeletal: Negative for arthralgias.  Neurological: Negative for dizziness and headaches.    Last hemoglobin A1c Lab Results  Component Value Date   HGBA1C 7.0 (A) 11/28/2020       Objective    BP (!) 126/91   Pulse 84   Temp 98 F (36.7 C)   Resp 16   Ht 5\' 6"  (1.676 m)   Wt 198 lb (89.8 kg)   BMI 31.96 kg/m  BP Readings from Last 3 Encounters:  11/28/20 (!) 126/91  05/18/20 122/90  09/28/19 130/90   Wt Readings from Last 3 Encounters:  11/28/20 198 lb (89.8 kg)  05/18/20 200 lb (90.7 kg)  09/28/19 202 lb (91.6 kg)       Physical Exam Vitals reviewed.  Constitutional:      Appearance: Normal appearance.  Cardiovascular:     Rate and Rhythm: Normal rate and regular rhythm.     Heart sounds: Normal heart sounds.  Pulmonary:     Effort: Pulmonary effort is normal.     Breath sounds: Normal breath sounds.  Skin:    General: Skin is warm and dry.  Neurological:     Mental Status: He is alert and oriented to person, place, and time. Mental status is at baseline.  Psychiatric:        Mood and Affect: Mood normal.        Behavior: Behavior normal.       Results for orders placed or performed in visit on 11/28/20  POCT glycosylated hemoglobin (Hb A1C)  Result Value Ref Range   Hemoglobin A1C 7.0 (A) 4.0 - 5.6 %   HbA1c POC (<> result, manual entry)     HbA1c, POC (prediabetic range)     HbA1c, POC (controlled diabetic range)      Assessment & Plan     1. Type 2 diabetes mellitus without complication, unspecified whether long term insulin use (HCC) Goal A1c less than 37 and a 65 year old - POCT glycosylated hemoglobin (Hb A1C)  2. Hyperlipidemia, unspecified hyperlipidemia type On lovastatin 40  3. Elevated PSA PSA was 5.5.  Prefers watchful waiting.  Repeat PSA but is if it is elevated more will refer to urology. - PSA  4. Pulmonary emphysema, unspecified emphysema type  (HCC) Add Spiriva daily  5. Schizophrenia, unspecified type (HCC)   6. Hypertension associated with diabetes (HCC)      No follow-ups on file.      I, 66, MD, have reviewed all documentation for this visit. The documentation on 12/04/20 for the exam, diagnosis, procedures, and orders are all accurate and complete.    Herve Haug 12/06/20, MD  [facility] [phone] (phone) [phone] (fax)  [facility]

## 2020-11-29 LAB — PSA: Prostate Specific Ag, Serum: 5.2 ng/mL — ABNORMAL HIGH (ref 0.0–4.0)

## 2020-11-30 ENCOUNTER — Other Ambulatory Visit: Payer: Self-pay | Admitting: Family Medicine

## 2020-11-30 ENCOUNTER — Telehealth: Payer: Self-pay | Admitting: Family Medicine

## 2020-11-30 DIAGNOSIS — I152 Hypertension secondary to endocrine disorders: Secondary | ICD-10-CM

## 2020-11-30 DIAGNOSIS — E119 Type 2 diabetes mellitus without complications: Secondary | ICD-10-CM

## 2020-11-30 DIAGNOSIS — E1159 Type 2 diabetes mellitus with other circulatory complications: Secondary | ICD-10-CM

## 2020-11-30 NOTE — Telephone Encounter (Signed)
Pt's brother in law called stating that the pharmacy will not refill pts BP medication due to "notes left on the prescription by Osvaldo Angst" that the pt should no longer be taking it. He also states that the pt advised him he spoke with PCP about getting an inhaler. Please advise.        [address] - [place], [place] - [address]  [address] [place] [address]  Phone: [phone] Fax: [phone]  Hours: Not open 24 hours

## 2020-11-30 NOTE — Telephone Encounter (Signed)
Patient was in the office the other day, I forgot to send in Spiriva. I will do that now. Did you want him to continue Metoprolol? Please advise. Thanks!

## 2020-12-01 NOTE — Telephone Encounter (Signed)
Patient brother in law Luis Woods called in to inform Dr That pharmacy did not receive Rx for Metoprolol or the inhaler. I tried calling the Pharmacy to verify but there was no answer only a recording. Asking if someone can please re send Rx today they are closed the weekend. [name] Ph# [phone]

## 2020-12-01 NOTE — Telephone Encounter (Signed)
Spoke with pharmacist at [facility] and clarified that they received prescription for Toprol -XL, brother in law is inquiring about Spiriva prescription? Please review. KW

## 2020-12-01 NOTE — Telephone Encounter (Signed)
Dr Reece Agar, please review. What dose for Spiriva?

## 2020-12-02 MED ORDER — TIOTROPIUM BROMIDE MONOHYDRATE 18 MCG IN CAPS: 18.0000 ug | ORAL_CAPSULE | RESPIRATORY_TRACT | 12 refills | Status: DC

## 2020-12-02 NOTE — Telephone Encounter (Signed)
Sent!

## 2020-12-05 ENCOUNTER — Telehealth: Payer: Self-pay

## 2020-12-05 NOTE — Telephone Encounter (Signed)
Called patient and no answer, LVMTRC. If patient calls back okay for [facility] to advise. 

## 2021-04-30 ENCOUNTER — Other Ambulatory Visit: Payer: Self-pay | Admitting: Family Medicine

## 2021-04-30 DIAGNOSIS — E119 Type 2 diabetes mellitus without complications: Secondary | ICD-10-CM

## 2021-04-30 NOTE — Telephone Encounter (Signed)
Future visit in 4 weeks  

## 2021-05-08 ENCOUNTER — Telehealth: Payer: Self-pay

## 2021-05-08 DIAGNOSIS — E119 Type 2 diabetes mellitus without complications: Secondary | ICD-10-CM

## 2021-05-08 MED ORDER — LOVASTATIN 40 MG PO TABS: 40.0000 mg | ORAL_TABLET | Freq: Every day | ORAL | 1 refills | Status: DC

## 2021-05-08 NOTE — Telephone Encounter (Signed)
Rx was sent  pharmacy. 

## 2021-05-08 NOTE — Telephone Encounter (Signed)
[facility] faxed refill request for the following medications: ° °lovastatin (MEVACOR) 40 MG tablet  ° °Please advise. ° °

## 2021-05-29 ENCOUNTER — Ambulatory Visit: Payer: Self-pay | Admitting: Family Medicine

## 2021-05-31 ENCOUNTER — Other Ambulatory Visit: Payer: Self-pay | Admitting: Family Medicine

## 2021-05-31 DIAGNOSIS — E1159 Type 2 diabetes mellitus with other circulatory complications: Secondary | ICD-10-CM

## 2021-05-31 DIAGNOSIS — I152 Hypertension secondary to endocrine disorders: Secondary | ICD-10-CM

## 2021-07-30 ENCOUNTER — Other Ambulatory Visit: Payer: Self-pay | Admitting: Family Medicine

## 2021-07-30 ENCOUNTER — Other Ambulatory Visit: Payer: Self-pay | Admitting: Physician Assistant

## 2021-07-30 DIAGNOSIS — E119 Type 2 diabetes mellitus without complications: Secondary | ICD-10-CM

## 2021-07-31 NOTE — Telephone Encounter (Signed)
Requested medication (s) are due for refill today: yes ° °Requested medication (s) are on the active medication list: yes ° °Last refill:  05/01/21 ° °Future visit scheduled: no, had appt 05/29/21, canceled by Dr. Chrismon. ° °Notes to clinic:  labs are over 360 days ago, 05/2020, visit 8 months ago, no upcoming visit scheduled, please assess. ° ° °Requested Prescriptions  °Pending Prescriptions Disp Refills  ° JANUMET XR 100-1000 MG TB24 [Pharmacy Med Name: JANUMET XR 100-1000 MG TAB] 90 tablet 0  °  Sig: TAKE 1 TABLET BY MOUTH ONCE A DAY  °  ° Endocrinology:  Diabetes - Biguanide + DPP-4 Inhibitor Combos Failed - 07/30/2021 12:35 PM  °  °  Failed - HBA1C is between 0 and 7.9 and within 180 days  °  Hemoglobin A1C  °Date Value Ref Range Status  °11/28/2020 7.0 (A) 4.0 - 5.6 % Final  ° °Hgb A1c MFr Bld  °Date Value Ref Range Status  °05/18/2020 7.2 (H) 4.8 - 5.6 % Final  °  Comment:  °           Prediabetes: 5.7 - 6.4 °         Diabetes: >6.4 °         Glycemic control for adults with diabetes: <7.0 °  °  °  °  °  Failed - Cr in normal range and within 360 days  °  Creatinine, Ser  °Date Value Ref Range Status  °05/18/2020 1.11 0.76 - 1.27 mg/dL Final  °  °  °  °  Failed - eGFR in normal range and within 360 days  °  GFR calc Af Amer  °Date Value Ref Range Status  °05/18/2020 82 >59 mL/min/1.73 Final  °  Comment:  °  **[facility] currently reports eGFR in compliance with the current** °  recommendations of the [employer]. [facility] will °  update reporting as new guidelines are published from the NKF-ASN °  Task force. °  ° °GFR calc non Af Amer  °Date Value Ref Range Status  °05/18/2020 71 >59 mL/min/1.73 Final  °  °  °  °  Failed - Valid encounter within last 6 months  °  Recent Outpatient Visits   ° °      ° 8 months ago Type 2 diabetes mellitus without complication, unspecified whether long term insulin use (HCC)  ° [facility] Gilbert, Richard L Jr., MD  ° 1 year ago Annual physical  exam  ° [facility] Pollak, Adriana M, PA-C  ° 1 year ago Type 2 diabetes mellitus without complication, unspecified whether long term insulin use (HCC)  ° [facility] Pollak, Adriana M, PA-C  ° 2 years ago Type 2 diabetes mellitus without complication, unspecified whether long term insulin use (HCC)  ° [facility] Pollak, Adriana M, PA-C  ° °  °  ° °  °  °  ° ° °

## 2021-10-30 ENCOUNTER — Other Ambulatory Visit: Payer: Self-pay | Admitting: Family Medicine

## 2021-10-30 DIAGNOSIS — E119 Type 2 diabetes mellitus without complications: Secondary | ICD-10-CM

## 2021-11-01 ENCOUNTER — Other Ambulatory Visit: Payer: Self-pay | Admitting: Family Medicine

## 2021-11-01 DIAGNOSIS — E119 Type 2 diabetes mellitus without complications: Secondary | ICD-10-CM

## 2021-11-07 ENCOUNTER — Other Ambulatory Visit: Payer: Self-pay

## 2021-11-07 ENCOUNTER — Encounter: Payer: Self-pay | Admitting: Physician Assistant

## 2021-11-07 ENCOUNTER — Ambulatory Visit (INDEPENDENT_AMBULATORY_CARE_PROVIDER_SITE_OTHER): Payer: Medicaid Other | Admitting: Physician Assistant

## 2021-11-07 VITALS — BP 124/91 | HR 78 | Ht 66.0 in | Wt 197.3 lb

## 2021-11-07 DIAGNOSIS — E1169 Type 2 diabetes mellitus with other specified complication: Secondary | ICD-10-CM | POA: Diagnosis not present

## 2021-11-07 DIAGNOSIS — Z125 Encounter for screening for malignant neoplasm of prostate: Secondary | ICD-10-CM

## 2021-11-07 DIAGNOSIS — Z23 Encounter for immunization: Secondary | ICD-10-CM

## 2021-11-07 DIAGNOSIS — I152 Hypertension secondary to endocrine disorders: Secondary | ICD-10-CM

## 2021-11-07 DIAGNOSIS — E785 Hyperlipidemia, unspecified: Secondary | ICD-10-CM

## 2021-11-07 DIAGNOSIS — E1165 Type 2 diabetes mellitus with hyperglycemia: Secondary | ICD-10-CM | POA: Diagnosis not present

## 2021-11-07 DIAGNOSIS — Z1211 Encounter for screening for malignant neoplasm of colon: Secondary | ICD-10-CM | POA: Diagnosis not present

## 2021-11-07 DIAGNOSIS — E1159 Type 2 diabetes mellitus with other circulatory complications: Secondary | ICD-10-CM

## 2021-11-07 LAB — POCT GLYCOSYLATED HEMOGLOBIN (HGB A1C): Hemoglobin A1C: 7.7 % — AB (ref 4.0–5.6)

## 2021-11-07 NOTE — Assessment & Plan Note (Signed)
Stable continue metoprolol 50 mg ? ?
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
## 2021-11-07 NOTE — Assessment & Plan Note (Signed)
Managed with lovastatin 40 mg daily ?Will recheck lipids/cmp ?

## 2021-11-07 NOTE — Assessment & Plan Note (Signed)
A1c 7.7% elevated from last visit 7.0%. Pt denies diet/exercise changes. ?Confirmed w/ Luis Woods [facility] that janumet xr / meformin xr use is appropriate.  ?Would like to add jardiance 10 mg daily, but will check cmp before prescribing. ?Pt voiced understanding.  ?Referred to ophthalmology  ? ?

## 2021-11-08 LAB — CBC WITH DIFFERENTIAL/PLATELET

## 2021-11-08 LAB — COMPREHENSIVE METABOLIC PANEL

## 2021-11-08 LAB — MICROALBUMIN / CREATININE URINE RATIO
Creatinine, Urine: 99.9 mg/dL
Microalb/Creat Ratio: 10 mg/g creat (ref 0–29)
Microalbumin, Urine: 9.9 ug/mL

## 2021-11-08 LAB — LIPID PANEL

## 2021-11-08 LAB — PSA

## 2021-11-28 ENCOUNTER — Other Ambulatory Visit: Payer: Self-pay | Admitting: Family Medicine

## 2021-11-28 ENCOUNTER — Other Ambulatory Visit: Payer: Self-pay | Admitting: Physician Assistant

## 2021-11-28 DIAGNOSIS — I152 Hypertension secondary to endocrine disorders: Secondary | ICD-10-CM

## 2021-11-28 DIAGNOSIS — E119 Type 2 diabetes mellitus without complications: Secondary | ICD-10-CM

## 2021-12-10 ENCOUNTER — Telehealth: Payer: Self-pay | Admitting: Physician Assistant

## 2021-12-10 NOTE — Telephone Encounter (Signed)
Ronnie Crotts pts brother in law is calling to report that the pt has a diabetic eye appt scheduled for 02/19/22 ?

## 2022-01-18 ENCOUNTER — Other Ambulatory Visit: Payer: Self-pay | Admitting: Physician Assistant

## 2022-01-18 DIAGNOSIS — E119 Type 2 diabetes mellitus without complications: Secondary | ICD-10-CM

## 2022-01-25 ENCOUNTER — Other Ambulatory Visit: Payer: Self-pay | Admitting: Family Medicine

## 2022-01-25 ENCOUNTER — Other Ambulatory Visit: Payer: Self-pay | Admitting: Physician Assistant

## 2022-01-25 DIAGNOSIS — E119 Type 2 diabetes mellitus without complications: Secondary | ICD-10-CM

## 2022-01-25 NOTE — Telephone Encounter (Signed)
Requested medication (s) are due for refill today: yes  Requested medication (s) are on the active medication list: yes  Last refill:  07/30/21 #180 with 0 RF  Future visit scheduled: 02/13/22, seen 11/07/21  Notes to clinic:  failed protocol due to creatinine from 2021, please assess.      Requested Prescriptions  Pending Prescriptions Disp Refills   metFORMIN (GLUCOPHAGE-XR) 500 MG 24 hr tablet [Pharmacy Med Name: METFORMIN HCL ER 500 MG TAB] 180 tablet 0    Sig: TAKE 2 TABLET BY MOUTH DAILY WITH BREAKFAST     Endocrinology:  Diabetes - Biguanides Failed - 01/25/2022  8:55 AM      Failed - Cr in normal range and within 360 days    Creatinine, Ser  Date Value Ref Range Status  05/18/2020 1.11 0.76 - 1.27 mg/dL Final         Failed - eGFR in normal range and within 360 days    GFR calc Af Amer  Date Value Ref Range Status  05/18/2020 82 >59 mL/min/1.73 Final    Comment:    **[facility] currently reports eGFR in compliance with the current**   recommendations of the [employer]. [facility] will   update reporting as new guidelines are published from the NKF-ASN   Task force.    GFR calc non Af Amer  Date Value Ref Range Status  05/18/2020 71 >59 mL/min/1.73 Final         Failed - B12 Level in normal range and within 720 days    No results found for: "VITAMINB12"       Passed - HBA1C is between 0 and 7.9 and within 180 days    Hemoglobin A1C  Date Value Ref Range Status  11/07/2021 7.7 (A) 4.0 - 5.6 % Final   Hgb A1c MFr Bld  Date Value Ref Range Status  05/18/2020 7.2 (H) 4.8 - 5.6 % Final    Comment:             Prediabetes: 5.7 - 6.4          Diabetes: >6.4          Glycemic control for adults with diabetes: <7.0          Passed - Valid encounter within last 6 months    Recent Outpatient Visits           2 months ago Type 2 diabetes mellitus with hyperglycemia, without long-term current use of insulin [facility])   [facility] Thedore Mins,  [place], PA-C   1 year ago Type 2 diabetes mellitus without complication, unspecified whether long term insulin use ([place])   [facility] Jerrol Banana., MD   1 year ago Annual physical exam   [facility] Carles Collet M, [place]   2 years ago Type 2 diabetes mellitus without complication, unspecified whether long term insulin use [facility])   [facility] Fletcher, [place], [place]   2 years ago Type 2 diabetes mellitus without complication, unspecified whether long term insulin use [facility])   [place], [place], [place]       Future Appointments             In 2 weeks Drubel, Ria Comment, PA-C [facility], [facility]            Passed - CBC within normal limits and completed in the last 12 months    WBC  Date Value Ref Range Status  11/07/2021 CANCELED  x10E3/uL     Comment:    Test not performed. No lavender top tube submitted.  Result canceled by the ancillary.    RBC  Date Value Ref Range Status  05/18/2020 4.94 4.14 - 5.80 x10E6/uL Final   Hemoglobin  Date Value Ref Range Status  05/18/2020 15.1 13.0 - 17.7 g/dL Final   Hematocrit  Date Value Ref Range Status  05/18/2020 45.4 37.5 - 51.0 % Final   MCHC  Date Value Ref Range Status  05/18/2020 33.3 31.5 - 35.7 g/dL Final   [facility]  Date Value Ref Range Status  05/18/2020 30.6 26.6 - 33.0 pg Final   MCV  Date Value Ref Range Status  05/18/2020 92 79 - 97 fL Final   No results found for: "PLTCOUNTKUC", "LABPLAT", "POCPLA" RDW  Date Value Ref Range Status  05/18/2020 12.8 11.6 - 15.4 % Final

## 2022-02-07 ENCOUNTER — Ambulatory Visit: Payer: Medicaid Other | Admitting: Physician Assistant

## 2022-02-13 ENCOUNTER — Ambulatory Visit (INDEPENDENT_AMBULATORY_CARE_PROVIDER_SITE_OTHER): Payer: Medicaid Other | Admitting: Physician Assistant

## 2022-02-13 ENCOUNTER — Encounter: Payer: Self-pay | Admitting: Physician Assistant

## 2022-02-13 VITALS — BP 123/87 | HR 86 | Ht 66.0 in | Wt 195.2 lb

## 2022-02-13 DIAGNOSIS — Z125 Encounter for screening for malignant neoplasm of prostate: Secondary | ICD-10-CM | POA: Diagnosis not present

## 2022-02-13 DIAGNOSIS — Z720 Tobacco use: Secondary | ICD-10-CM

## 2022-02-13 DIAGNOSIS — E785 Hyperlipidemia, unspecified: Secondary | ICD-10-CM

## 2022-02-13 DIAGNOSIS — I152 Hypertension secondary to endocrine disorders: Secondary | ICD-10-CM

## 2022-02-13 DIAGNOSIS — J439 Emphysema, unspecified: Secondary | ICD-10-CM

## 2022-02-13 DIAGNOSIS — E1169 Type 2 diabetes mellitus with other specified complication: Secondary | ICD-10-CM

## 2022-02-13 DIAGNOSIS — E1165 Type 2 diabetes mellitus with hyperglycemia: Secondary | ICD-10-CM

## 2022-02-13 DIAGNOSIS — E1159 Type 2 diabetes mellitus with other circulatory complications: Secondary | ICD-10-CM | POA: Diagnosis not present

## 2022-02-13 LAB — POCT GLYCOSYLATED HEMOGLOBIN (HGB A1C): Hemoglobin A1C: 6.9 % — AB (ref 4.0–5.6)

## 2022-02-13 MED ORDER — TIOTROPIUM BROMIDE MONOHYDRATE 18 MCG IN CAPS: 18.0000 ug | ORAL_CAPSULE | RESPIRATORY_TRACT | 2 refills | Status: DC

## 2022-02-13 MED ORDER — ALBUTEROL SULFATE HFA 108 (90 BASE) MCG/ACT IN AERS: 2.0000 | INHALATION_SPRAY | Freq: Four times a day (QID) | RESPIRATORY_TRACT | 1 refills | Status: DC | PRN

## 2022-02-13 NOTE — Assessment & Plan Note (Signed)
Needs LCT last was 2020

## 2022-02-13 NOTE — Assessment & Plan Note (Signed)
Managed with lovastatin 40 mg Need to recheck lipids did not proceed with testing last visit

## 2022-02-13 NOTE — Assessment & Plan Note (Addendum)
Last A1c 7.7% today 6.9% Currently managing with janumet, metformin, jardiance. uacr w/in year Previously referred to optho, overdue.  Foot exam today On statin  Still smoking F/u 4 months

## 2022-02-13 NOTE — Progress Notes (Signed)
I,Sha'taria Tyson,acting as a Neurosurgeon for [employer], PA-C.,have documented all relevant documentation on the behalf of Alfredia Ferguson, PA-C,as directed by  Alfredia Ferguson, PA-C while in the presence of Alfredia Ferguson, PA-C.   Established patient visit   Patient: Luis Woods   DOB: 18-Aug-1955   66 y.o. Male  MRN: [id number] Visit Date: 02/13/2022  Today's healthcare provider: Alfredia Ferguson, PA-C   Cc. DM II f/u / chronic condition f./u  Subjective    HPI  Diabetes Mellitus Type II, Follow-up  Lab Results  Component Value Date   HGBA1C 6.9 (A) 02/13/2022   HGBA1C 7.7 (A) 11/07/2021   HGBA1C 7.0 (A) 11/28/2020   Wt Readings from Last 3 Encounters:  02/13/22 195 lb 3.2 oz (88.5 kg)  11/07/21 197 lb 4.8 oz (89.5 kg)  11/28/20 198 lb (89.8 kg)   Last seen for diabetes 3 months ago.  Management since then includes continue current medication and add jardiance 10 mg daily. He reports fair compliance with treatment. He is not having side effects.  Symptoms: No fatigue No foot ulcerations  No appetite changes No nausea  No paresthesia of the feet  No polydipsia  No polyuria No visual disturbances   No vomiting     Home blood sugar records:  not being checked  Episodes of hypoglycemia? 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 Cannot be very physically  active anymore. Will occasionally have coughing spells where his chest is tight/sob/dizzy.  -Still smoking.   Medications: Outpatient Medications Prior to Visit  Medication Sig   IMIPRAMINE HCL PO Take 1 capsule by mouth 3 (three) times daily.   lovastatin (MEVACOR) 40 MG tablet TAKE 1 TABLET BY MOUTH EVERY NIGHT AT BEDTIME   metFORMIN (GLUCOPHAGE-XR) 500 MG 24 hr tablet TAKE 2 TABLET BY MOUTH DAILY WITH BREAKFAST   metoprolol succinate (TOPROL-XL) 50 MG 24 hr tablet TAKE 1 TABLET BY MOUTH ONCE A DAY   perphenazine (TRILAFON) 2 MG tablet Take 2 mg by mouth 2 (two) times daily.   SitaGLIPtin-MetFORMIN HCl (JANUMET XR) [phone] MG TB24 Take 1 tablet by mouth daily.   [DISCONTINUED] albuterol (VENTOLIN HFA) 108 (90 Base) MCG/ACT inhaler Inhale 2 puffs into the lungs every 6 (six) hours as needed for wheezing or shortness of breath.   [DISCONTINUED] tiotropium (SPIRIVA) 18 MCG inhalation capsule Place 1 capsule (18 mcg total) into inhaler and inhale 1 day or 1 dose for 1 dose.   No facility-administered medications prior to visit.    Review of Systems  Constitutional:  Negative for fatigue and fever.  Respiratory:  Positive for cough. Negative for shortness of breath.   Cardiovascular:  Negative for chest pain, palpitations and leg swelling.  Neurological:  Negative for dizziness and headaches.       Objective    Blood pressure 123/87, pulse 86, height 5\' 6"  (1.676 m), weight 195 lb 3.2 oz (88.5 kg), SpO2  98 %.   Physical Exam Constitutional:      General: He is awake.     Appearance: He is well-developed.  HENT:     Head: Normocephalic.  Eyes:     Conjunctiva/sclera: Conjunctivae normal.  Cardiovascular:     Rate and Rhythm: Normal rate and regular rhythm.     Pulses:          Dorsalis pedis pulses are 2+ on the right side and 2+ on the left side.     Heart sounds: Normal heart sounds.  Pulmonary:     Effort: Pulmonary effort is normal.     Breath sounds: Normal breath  sounds.  Feet:     Right foot:     Protective Sensation: 4 sites tested.  4 sites sensed.     Skin integrity: Callus present.     Toenail Condition: Right toenails are normal.     Left foot:     Protective Sensation: 4 sites tested.  4 sites sensed.     Skin integrity: Callus present.     Toenail Condition: Left toenails are normal.  Skin:    General: Skin is warm.  Neurological:     Mental Status: He is alert and oriented to person, place, and time.  Psychiatric:        Attention and Perception: Attention normal.        Mood and Affect: Mood normal.        Speech: Speech normal.        Behavior: Behavior is cooperative.      Results for orders placed or performed in visit on 02/13/22  POCT HgB A1C  Result Value Ref Range   Hemoglobin A1C 6.9 (A) 4.0 - 5.6 %   HbA1c POC (<> result, manual entry)     HbA1c, POC (prediabetic range)     HbA1c, POC (controlled diabetic range)      Assessment & Plan     Problem List Items Addressed This Visit       Cardiovascular and Mediastinum   Hypertension associated with diabetes (HCC)    Well controlled continue current medications Ordered cmp F/u 6 mo      Relevant Orders   Comprehensive Metabolic Panel (CMET)     Respiratory   Pulmonary emphysema (HCC)    Could not find formal pfts or pulm visit on brief chart review. Ideally would prefer pfts but we have several things currently pending and do not want to overwhelm.  For now, refilling albuterol and spirivia.  Education on how to use inhalers, purpose. Really encouraged daily spirivia use. Discussed smoking cessation      Relevant Medications   albuterol (VENTOLIN HFA) 108 (90 Base) MCG/ACT inhaler   tiotropium (SPIRIVA) 18 MCG inhalation capsule     Endocrine   Type 2 diabetes mellitus with hyperglycemia (HCC) - Primary    Last A1c 7.7% today 6.9% Currently managing with janumet, metformin, jardiance. uacr w/in year Previously referred to optho, overdue.  Foot  exam today On statin  Still smoking F/u 4 months      Relevant Orders   POCT HgB A1C (Completed)   CBC w/Diff/Platelet   Hyperlipidemia associated with type 2 diabetes mellitus (HCC)    Managed with lovastatin 40 mg Need to recheck lipids did not proceed with testing last visit      Relevant Orders   Lipid Profile     Other   Tobacco abuse    Needs LCT last was 2020  Relevant Orders   Ambulatory Referral Lung Cancer Screening [facility]   Other Visit Diagnoses     Prostate cancer screening       Relevant Orders   PSA      Pt still needs to complete cologuard. He has it at home.  Return in about 4 months (around 06/16/2022) for DMII, COPD.      I, Mikey Kirschner, PA-C have reviewed all documentation for this visit. The documentation on  02/13/2022 for the exam, diagnosis, procedures, and orders are all accurate and complete.  Mikey Kirschner, PA-C [facility] [address] [place], [place], [address] Office: [phone] Fax: [place]

## 2022-02-13 NOTE — Assessment & Plan Note (Addendum)
Could not find formal pfts or pulm visit on brief chart review. Ideally would prefer pfts but we have several things currently pending and do not want to overwhelm.  For now, refilling albuterol and spirivia.  Education on how to use inhalers, purpose. Really encouraged daily spirivia use. Discussed smoking cessation

## 2022-02-13 NOTE — Assessment & Plan Note (Addendum)
Well controlled continue current medications Ordered cmp F/u 6 mo  

## 2022-02-14 ENCOUNTER — Telehealth: Payer: Self-pay

## 2022-02-14 ENCOUNTER — Other Ambulatory Visit: Payer: Self-pay | Admitting: Physician Assistant

## 2022-02-14 DIAGNOSIS — E1169 Type 2 diabetes mellitus with other specified complication: Secondary | ICD-10-CM

## 2022-02-14 LAB — COMPREHENSIVE METABOLIC PANEL
ALT: 11 IU/L (ref 0–44)
AST: 12 IU/L (ref 0–40)
Albumin/Globulin Ratio: 1.7 (ref 1.2–2.2)
Albumin: 4.4 g/dL (ref 3.8–4.8)
Alkaline Phosphatase: 89 IU/L (ref 44–121)
BUN/Creatinine Ratio: 12 (ref 10–24)
BUN: 14 mg/dL (ref 8–27)
Bilirubin Total: 0.4 mg/dL (ref 0.0–1.2)
CO2: 23 mmol/L (ref 20–29)
Calcium: 10.2 mg/dL (ref 8.6–10.2)
Chloride: 100 mmol/L (ref 96–106)
Creatinine, Ser: 1.16 mg/dL (ref 0.76–1.27)
Globulin, Total: 2.6 g/dL (ref 1.5–4.5)
Glucose: 148 mg/dL — ABNORMAL HIGH (ref 70–99)
Potassium: 5.2 mmol/L (ref 3.5–5.2)
Sodium: 138 mmol/L (ref 134–144)
Total Protein: 7 g/dL (ref 6.0–8.5)
eGFR: 70 mL/min/{1.73_m2} (ref >59)

## 2022-02-14 LAB — LIPID PANEL
Chol/HDL Ratio: 4.8 ratio (ref 0.0–5.0)
Cholesterol, Total: 143 mg/dL (ref 100–199)
HDL: 30 mg/dL — ABNORMAL LOW (ref >39)
LDL Chol Calc (NIH): 91 mg/dL (ref 0–99)
Triglycerides: 119 mg/dL (ref 0–149)
VLDL Cholesterol Cal: 22 mg/dL (ref 5–40)

## 2022-02-14 LAB — CBC WITH DIFFERENTIAL/PLATELET
Basophils Absolute: 0.1 10*3/uL (ref 0.0–0.2)
Basos: 1 %
EOS (ABSOLUTE): 0.1 10*3/uL (ref 0.0–0.4)
Eos: 1 %
Hematocrit: 43.7 % (ref 37.5–51.0)
Hemoglobin: 15.1 g/dL (ref 13.0–17.7)
Immature Grans (Abs): 0 10*3/uL (ref 0.0–0.1)
Immature Granulocytes: 0 %
Lymphocytes Absolute: 2.6 10*3/uL (ref 0.7–3.1)
Lymphs: 31 %
MCH: 31.3 pg (ref 26.6–33.0)
MCHC: 34.6 g/dL (ref 31.5–35.7)
MCV: 91 fL (ref 79–97)
Monocytes Absolute: 0.7 10*3/uL (ref 0.1–0.9)
Monocytes: 9 %
Neutrophils Absolute: 4.8 10*3/uL (ref 1.4–7.0)
Neutrophils: 58 %
Platelets: 271 10*3/uL (ref 150–450)
RBC: 4.82 x10E6/uL (ref 4.14–5.80)
RDW: 12.5 % (ref 11.6–15.4)
WBC: 8.3 10*3/uL (ref 3.4–10.8)

## 2022-02-14 LAB — PSA: Prostate Specific Ag, Serum: 5.3 ng/mL — ABNORMAL HIGH (ref 0.0–4.0)

## 2022-02-14 MED ORDER — ATORVASTATIN CALCIUM 40 MG PO TABS: 40.0000 mg | ORAL_TABLET | Freq: Every day | ORAL | 3 refills | Status: DC

## 2022-02-14 NOTE — Telephone Encounter (Signed)
Pt. Given lab results and instructions, verbalizes understanding. 

## 2022-02-19 LAB — HM DIABETES EYE EXAM

## 2022-04-03 ENCOUNTER — Telehealth: Payer: Self-pay

## 2022-04-03 NOTE — Telephone Encounter (Signed)
Left VM and call back info for pt to call for scheduling of annual LDCt

## 2022-04-25 ENCOUNTER — Other Ambulatory Visit: Payer: Self-pay | Admitting: Physician Assistant

## 2022-04-25 DIAGNOSIS — E119 Type 2 diabetes mellitus without complications: Secondary | ICD-10-CM

## 2022-05-22 ENCOUNTER — Other Ambulatory Visit: Payer: Self-pay | Admitting: Physician Assistant

## 2022-05-22 DIAGNOSIS — I152 Hypertension secondary to endocrine disorders: Secondary | ICD-10-CM

## 2022-06-17 ENCOUNTER — Ambulatory Visit: Payer: Self-pay | Admitting: *Deleted

## 2022-06-17 ENCOUNTER — Encounter: Payer: Self-pay | Admitting: Physician Assistant

## 2022-06-17 ENCOUNTER — Ambulatory Visit (INDEPENDENT_AMBULATORY_CARE_PROVIDER_SITE_OTHER): Payer: Medicaid Other | Admitting: Physician Assistant

## 2022-06-17 VITALS — BP 113/82 | HR 84 | Resp 16 | Wt 189.0 lb

## 2022-06-17 DIAGNOSIS — Z23 Encounter for immunization: Secondary | ICD-10-CM | POA: Diagnosis not present

## 2022-06-17 DIAGNOSIS — L989 Disorder of the skin and subcutaneous tissue, unspecified: Secondary | ICD-10-CM

## 2022-06-17 DIAGNOSIS — E1165 Type 2 diabetes mellitus with hyperglycemia: Secondary | ICD-10-CM | POA: Diagnosis not present

## 2022-06-17 DIAGNOSIS — E1159 Type 2 diabetes mellitus with other circulatory complications: Secondary | ICD-10-CM | POA: Diagnosis not present

## 2022-06-17 DIAGNOSIS — I152 Hypertension secondary to endocrine disorders: Secondary | ICD-10-CM

## 2022-06-17 DIAGNOSIS — B359 Dermatophytosis, unspecified: Secondary | ICD-10-CM

## 2022-06-17 DIAGNOSIS — L0291 Cutaneous abscess, unspecified: Secondary | ICD-10-CM

## 2022-06-17 LAB — POCT GLYCOSYLATED HEMOGLOBIN (HGB A1C)
Est. average glucose Bld gHb Est-mCnc: 169
Hemoglobin A1C: 7.5 % — AB (ref 4.0–5.6)

## 2022-06-17 MED ORDER — KETOCONAZOLE 2 % EX CREA: 1.0000 | TOPICAL_CREAM | Freq: Every day | CUTANEOUS | 0 refills | Status: DC

## 2022-06-17 MED ORDER — AMOXICILLIN 875 MG PO TABS: 875.0000 mg | ORAL_TABLET | Freq: Two times a day (BID) | ORAL | 0 refills | Status: DC

## 2022-06-17 MED ORDER — EMPAGLIFLOZIN 10 MG PO TABS: 10.0000 mg | ORAL_TABLET | Freq: Every day | ORAL | 1 refills | Status: DC

## 2022-06-17 NOTE — Telephone Encounter (Signed)
  Summary: Medication questions   Pt's sister Luis Woods called with Medication questions regarding his Luis Woods  Best contact: [phone]       Chief Complaint: pt sister on DPR requesting if patient is to take all medications for diabetic management, metformin 500 mg XR, janumet XR 100-1000mg  , jardiance 10 mg before breakfast Symptoms: na Frequency: today  Pertinent Negatives: Patient denies na Disposition: [] ED /[] Urgent Care (no appt availability in office) / [] Appointment(In office/virtual)/ []  [place] [facility]/ [] Home Care/ [] Refused Recommended Disposition /[] [place] [facility]/ [x]  Follow-up with PCP Additional Notes:   Please advise if all medication are to be taken for diabetic management or if any should be discontinued. Patient 's sister would like a call back.  Luis Woods, [phone].     Reason for Disposition  [1] Caller has NON-URGENT medicine question about med that PCP prescribed AND [2] triager unable to answer question  Answer Assessment - Initial Assessment Questions 1. NAME of MEDICINE: "What medicine(s) are you calling about?"     Patent sister on DPR calling about , metformin 500 mg XR, janumet 100-1000mg  XR and jardiance 10 mg before breakfast. 2. QUESTION: "What is your question?" (e.g., double dose of medicine, side effect)     Does patient need to take all of the medications or should any be discontinued.? 3. PRESCRIBER: "Who prescribed the medicine?" Reason: if prescribed by specialist, call should be referred to that group.     PCP 4. SYMPTOMS: "Do you have any symptoms?" If Yes, ask: "What symptoms are you having?"  "How bad are the symptoms (e.g., mild, moderate, severe)     na 5. PREGNANCY:  "Is there any chance that you are pregnant?" "When was your last menstrual period?"     na  Protocols used: Medication Question Call-A-AH

## 2022-06-17 NOTE — Assessment & Plan Note (Signed)
Prior A1c 6.9%, had continued meds, today 7.5% Pt not consistently checking sugars. Managed now with metformin 500 mg, janument 100 - 1000 mg.  Discussion w/ patient lifestyle changes vs medication, pt would prefer medication. Adding jardiance 10 mg today. On statin Foot exam next visit, uacr utd F/u 3 mo

## 2022-06-17 NOTE — Progress Notes (Signed)
I,April Miller,acting as a Education administrator for [employer], PA-C.,have documented all relevant documentation on the behalf of Mikey Kirschner, PA-C,as directed by  Mikey Kirschner, PA-C while in the presence of Mikey Kirschner, PA-C.   Established patient visit   Patient: Luis Woods   DOB: 06-13-1956   66 y.o. Male  MRN: [id number] Visit Date: 06/17/2022  Today's healthcare provider: Mikey Kirschner, PA-C   Chief Complaint  Patient presents with  . Follow-up  . Diabetes  . Hypertension  . Hyperlipidemia   Subjective    HPI   A1c back up adding jardiance  Diabetes Mellitus Type II, follow-up  Lab Results  Component Value Date   HGBA1C 6.9 (A) 02/13/2022   HGBA1C 7.7 (A) 11/07/2021   HGBA1C 7.0 (A) 11/28/2020   Last seen for diabetes 4 months ago.  Management since then includes continuing the same treatment.  Home blood sugar records: fasting range: not checking Most Recent Eye Exam: 02/19/2022  --------------------------------------------------------------------------------------------------- Hypertension, follow-up  BP Readings from Last 3 Encounters:  06/17/22 113/82  02/13/22 123/87  11/07/21 (!) 124/91   Wt Readings from Last 3 Encounters:  06/17/22 189 lb (85.7 kg)  02/13/22 195 lb 3.2 oz (88.5 kg)  11/07/21 197 lb 4.8 oz (89.5 kg)     He was last seen for hypertension 4 months ago.  Management since that visit includes well controlled on current medications.  Outside blood pressures are not checking.  ---------------------------------------------------------------------------------------------------  ---------------------------------------------------------------------------------------------------   Medications: Outpatient Medications Prior to Visit  Medication Sig  . albuterol (VENTOLIN HFA) 108 (90 Base) MCG/ACT inhaler Inhale 2 puffs into the lungs every 6 (six) hours as needed for wheezing or shortness of breath.  Marland Kitchen atorvastatin (LIPITOR) 40  MG tablet Take 1 tablet (40 mg total) by mouth daily.  . IMIPRAMINE HCL PO Take 1 capsule by mouth 3 (three) times daily.  Marland Kitchen lovastatin (MEVACOR) 40 MG tablet TAKE 1 TABLET BY MOUTH EVERY NIGHT AT BEDTIME  . metFORMIN (GLUCOPHAGE-XR) 500 MG 24 hr tablet TAKE 2 TABLET BY MOUTH DAILY WITH BREAKFAST  . metoprolol succinate (TOPROL-XL) 50 MG 24 hr tablet TAKE 1 TABLET BY MOUTH ONCE A DAY  . perphenazine (TRILAFON) 2 MG tablet Take 2 mg by mouth 2 (two) times daily.  . SitaGLIPtin-MetFORMIN HCl (JANUMET XR) [phone] MG TB24 Take 1 tablet by mouth daily.  Marland Kitchen tiotropium (SPIRIVA) 18 MCG inhalation capsule Place 1 capsule (18 mcg total) into inhaler and inhale 1 day or 1 dose for 1 dose.   No facility-administered medications prior to visit.    Review of Systems  Constitutional:  Negative for appetite change, chills and fever.  Respiratory:  Negative for chest tightness, shortness of breath and wheezing.   Cardiovascular:  Negative for chest pain and palpitations.  Gastrointestinal:  Negative for abdominal pain, nausea and vomiting.    {Labs  Heme  Chem  Endocrine  Serology  Results Review (optional):23779}   Objective    BP 113/82 (BP Location: Right Arm, Patient Position: Sitting, Cuff Size: Normal)   Pulse 84   Resp 16   Wt 189 lb (85.7 kg)   SpO2 98%   BMI 30.51 kg/m  {Show previous vital signs (optional):23777}  Physical Exam  ***  No results found for any visits on 06/17/22.  Assessment & Plan     ***  No follow-ups on file.      {provider attestation***:1}   Mikey Kirschner, PA-C  [facility] [phone] (phone) [phone] (fax)  [place]

## 2022-06-18 ENCOUNTER — Encounter: Payer: Self-pay | Admitting: Physician Assistant

## 2022-06-18 DIAGNOSIS — L989 Disorder of the skin and subcutaneous tissue, unspecified: Secondary | ICD-10-CM | POA: Insufficient documentation

## 2022-06-18 DIAGNOSIS — B359 Dermatophytosis, unspecified: Secondary | ICD-10-CM | POA: Insufficient documentation

## 2022-06-18 DIAGNOSIS — L0291 Cutaneous abscess, unspecified: Secondary | ICD-10-CM | POA: Insufficient documentation

## 2022-06-18 NOTE — Assessment & Plan Note (Signed)
Well controlled today Continue medications F/u 6 mo

## 2022-06-18 NOTE — Assessment & Plan Note (Signed)
Large 4-5 cm flutuant warm abscess on thoracic back.  Pt advised the office is out of lidocaine, he would like [place] to attempt I&D regardless. Area was prepped w/ betadine, a small incision was placed at the most fluctuant area. Some purrulent material was expressed.  Unfortunately 2/2 pain did not probe deeper into abscess.  Dressed wound, rx amoxicillin 875 mg bid x 7 days.  Pt will return in 2 days for dressing change d/t difficult area on back-- he is unable to change himself. May attempt second I&D at that time if we get lidocaine in stock

## 2022-06-18 NOTE — Assessment & Plan Note (Addendum)
Many skin tags, large SKs Advised pt likely wise for him to see a dermatologist.  Referred.

## 2022-06-18 NOTE — Assessment & Plan Note (Signed)
On neck Rx ketoconazole

## 2022-06-19 ENCOUNTER — Encounter: Payer: Self-pay | Admitting: Physician Assistant

## 2022-06-19 ENCOUNTER — Ambulatory Visit (INDEPENDENT_AMBULATORY_CARE_PROVIDER_SITE_OTHER): Payer: Medicaid Other | Admitting: Physician Assistant

## 2022-06-19 VITALS — BP 102/79 | HR 86 | Temp 98.0°F | Resp 16 | Wt 185.0 lb

## 2022-06-19 DIAGNOSIS — L0291 Cutaneous abscess, unspecified: Secondary | ICD-10-CM | POA: Diagnosis not present

## 2022-06-19 NOTE — Progress Notes (Signed)
I,Roshena L Chambers,acting as a scribe for [employer], PA-C.,have documented all relevant documentation on the behalf of Mikey Kirschner, PA-C,as directed by  Mikey Kirschner, PA-C while in the presence of Mikey Kirschner, PA-C.  Established patient visit   Patient: Luis Woods   DOB: 11-04-1955   66 y.o. Male  MRN: [id number] Visit Date: 06/19/2022  Today's healthcare provider: Mikey Kirschner, PA-C   Chief Complaint  Patient presents with   Follow-up   Subjective    HPI  Follow up for abscess  The patient was last seen for this 2 days ago. During that visit I & D was attempted. Dressed wound, rx amoxicillin 875 mg bid x 7 days.  Pt will return in 2 days for dressing change d/t difficult area on back-- he is unable to change himself. May attempt second I&D at that time if we get lidocaine in stock  He reports good compliance with treatment.  -----------------------------------------------------------------------------------------   Medications: Outpatient Medications Prior to Visit  Medication Sig   albuterol (VENTOLIN HFA) 108 (90 Base) MCG/ACT inhaler Inhale 2 puffs into the lungs every 6 (six) hours as needed for wheezing or shortness of breath.   amoxicillin (AMOXIL) 875 MG tablet Take 1 tablet (875 mg total) by mouth 2 (two) times daily.   atorvastatin (LIPITOR) 40 MG tablet Take 1 tablet (40 mg total) by mouth daily.   empagliflozin (JARDIANCE) 10 MG TABS tablet Take 1 tablet (10 mg total) by mouth daily before breakfast.   IMIPRAMINE HCL PO Take 1 capsule by mouth 3 (three) times daily.   ketoconazole (NIZORAL) 2 % cream Apply 1 Application topically daily.   lovastatin (MEVACOR) 40 MG tablet TAKE 1 TABLET BY MOUTH EVERY NIGHT AT BEDTIME   metFORMIN (GLUCOPHAGE-XR) 500 MG 24 hr tablet TAKE 2 TABLET BY MOUTH DAILY WITH BREAKFAST   metoprolol succinate (TOPROL-XL) 50 MG 24 hr tablet TAKE 1 TABLET BY MOUTH ONCE A DAY   perphenazine (TRILAFON) 2 MG tablet Take 2  mg by mouth 2 (two) times daily.   SitaGLIPtin-MetFORMIN HCl (JANUMET XR) [phone] MG TB24 Take 1 tablet by mouth daily.   tiotropium (SPIRIVA) 18 MCG inhalation capsule Place 1 capsule (18 mcg total) into inhaler and inhale 1 day or 1 dose for 1 dose.   No facility-administered medications prior to visit.    Review of Systems  Constitutional:  Negative for appetite change, chills and fever.  Respiratory:  Negative for chest tightness, shortness of breath and wheezing.   Cardiovascular:  Negative for chest pain and palpitations.  Gastrointestinal:  Negative for abdominal pain, nausea and vomiting.  Skin:  Positive for wound.    Objective    BP 102/79 (BP Location: Right Arm, Patient Position: Sitting, Cuff Size: Normal)   Pulse 86   Temp 98 F (36.7 C) (Oral)   Resp 16   Wt 185 lb (83.9 kg)   SpO2 98% Comment: room air  BMI 29.86 kg/m    Physical Exam Skin:    Comments: Abscess of skin along thoracic spine still 4-5 cm, erythematous, now draining purulent fluid    No results found for any visits on 06/19/22.  Assessment & Plan     Problem List Items Addressed This Visit       Other   Abscess - Primary    Area was prepped with betadine 2 cc of 1% xylocaine  was used to numb the area The original incision was widened and deepened A significant amount  of purulent material was obtained The area was flushed w normal saline Area was re-bandaged, as pt did not want to return to office for further management, the abscess was not packed. Advised pt to return if he needed any help with bandage changes, any further concerns.       Return if symptoms worsen or fail to improve.      I, Alfredia Ferguson, PA-C have reviewed all documentation for this visit. The documentation on  06/19/2022  for the exam, diagnosis, procedures, and orders are all accurate and complete.  Alfredia Ferguson, PA-C [facility] [address] [place], [place], [address] Office:  [phone] Fax: [phone]   [facility]

## 2022-06-19 NOTE — Telephone Encounter (Signed)
Spoke with patient's sister and advised.

## 2022-06-19 NOTE — Assessment & Plan Note (Signed)
Area was prepped with betadine 2 cc of 1% xylocaine  was used to numb the area The original incision was widened and deepened A significant amount of purulent material was obtained The area was flushed w normal saline Area was re-bandaged, as pt did not want to return to office for further management, the abscess was not packed. Advised pt to return if he needed any help with bandage changes, any further concerns.

## 2022-07-22 ENCOUNTER — Other Ambulatory Visit: Payer: Self-pay | Admitting: Physician Assistant

## 2022-07-22 DIAGNOSIS — E119 Type 2 diabetes mellitus without complications: Secondary | ICD-10-CM

## 2022-07-23 ENCOUNTER — Other Ambulatory Visit: Payer: Self-pay | Admitting: Physician Assistant

## 2022-07-23 DIAGNOSIS — E119 Type 2 diabetes mellitus without complications: Secondary | ICD-10-CM

## 2022-07-29 ENCOUNTER — Other Ambulatory Visit: Payer: Self-pay | Admitting: Physician Assistant

## 2022-07-29 DIAGNOSIS — E119 Type 2 diabetes mellitus without complications: Secondary | ICD-10-CM

## 2022-10-15 ENCOUNTER — Other Ambulatory Visit: Payer: Self-pay | Admitting: Physician Assistant

## 2022-10-15 DIAGNOSIS — E119 Type 2 diabetes mellitus without complications: Secondary | ICD-10-CM

## 2022-10-23 ENCOUNTER — Other Ambulatory Visit: Payer: Self-pay | Admitting: Physician Assistant

## 2022-10-23 DIAGNOSIS — E119 Type 2 diabetes mellitus without complications: Secondary | ICD-10-CM

## 2022-10-24 ENCOUNTER — Other Ambulatory Visit: Payer: Self-pay | Admitting: Physician Assistant

## 2022-10-24 DIAGNOSIS — E119 Type 2 diabetes mellitus without complications: Secondary | ICD-10-CM

## 2022-11-25 ENCOUNTER — Other Ambulatory Visit: Payer: Self-pay | Admitting: Physician Assistant

## 2022-11-25 DIAGNOSIS — E1159 Type 2 diabetes mellitus with other circulatory complications: Secondary | ICD-10-CM

## 2022-12-12 ENCOUNTER — Other Ambulatory Visit: Payer: Self-pay | Admitting: Physician Assistant

## 2022-12-12 DIAGNOSIS — E1165 Type 2 diabetes mellitus with hyperglycemia: Secondary | ICD-10-CM

## 2023-03-06 ENCOUNTER — Ambulatory Visit: Payer: Medicare Other | Admitting: Family Medicine

## 2023-03-06 ENCOUNTER — Ambulatory Visit (INDEPENDENT_AMBULATORY_CARE_PROVIDER_SITE_OTHER): Payer: Medicare Other | Admitting: Family Medicine

## 2023-03-06 ENCOUNTER — Encounter: Payer: Self-pay | Admitting: Family Medicine

## 2023-03-06 VITALS — BP 115/83 | HR 78 | Ht 66.0 in | Wt 183.4 lb

## 2023-03-06 DIAGNOSIS — E1165 Type 2 diabetes mellitus with hyperglycemia: Secondary | ICD-10-CM | POA: Diagnosis not present

## 2023-03-06 DIAGNOSIS — E1169 Type 2 diabetes mellitus with other specified complication: Secondary | ICD-10-CM

## 2023-03-06 DIAGNOSIS — R350 Frequency of micturition: Secondary | ICD-10-CM

## 2023-03-06 DIAGNOSIS — R399 Unspecified symptoms and signs involving the genitourinary system: Secondary | ICD-10-CM

## 2023-03-06 DIAGNOSIS — E119 Type 2 diabetes mellitus without complications: Secondary | ICD-10-CM

## 2023-03-06 DIAGNOSIS — F17200 Nicotine dependence, unspecified, uncomplicated: Secondary | ICD-10-CM

## 2023-03-06 DIAGNOSIS — J439 Emphysema, unspecified: Secondary | ICD-10-CM

## 2023-03-06 DIAGNOSIS — E1159 Type 2 diabetes mellitus with other circulatory complications: Secondary | ICD-10-CM | POA: Diagnosis not present

## 2023-03-06 DIAGNOSIS — Z5320 Procedure and treatment not carried out because of patient's decision for unspecified reasons: Secondary | ICD-10-CM

## 2023-03-06 DIAGNOSIS — E785 Hyperlipidemia, unspecified: Secondary | ICD-10-CM

## 2023-03-06 DIAGNOSIS — I152 Hypertension secondary to endocrine disorders: Secondary | ICD-10-CM

## 2023-03-06 MED ORDER — ALBUTEROL SULFATE HFA 108 (90 BASE) MCG/ACT IN AERS
2.0000 | INHALATION_SPRAY | Freq: Four times a day (QID) | RESPIRATORY_TRACT | 11 refills | Status: DC | PRN
Start: 2023-03-06 — End: 2023-12-16

## 2023-03-06 NOTE — Progress Notes (Signed)
Established patient visit   Patient: Luis Woods   DOB: 01/28/1956   67 y.o. Male  MRN: [id number] Visit Date: 03/06/2023  Today's healthcare provider: Jacky Kindle, FNP  Introduced to nurse practitioner role and practice setting.  All questions answered.  Discussed provider/patient relationship and expectations.  Chief Complaint  Patient presents with   Medical Management of Chronic Issues    Patient is present for 8 month follow-up. Patient reports he is taking medications as prescribed and does not monitor blood pressure or sugar at home. Reports no symptoms.    Subjective    HPI HPI     Medical Management of Chronic Issues    Additional comments: Patient is present for 8 month follow-up. Patient reports he is taking medications as prescribed and does not monitor blood pressure or sugar at home. Reports no symptoms.       Last edited by Acey Lav, CMA on 03/06/2023  1:25 PM.      Medications: Outpatient Medications Prior to Visit  Medication Sig   amoxicillin (AMOXIL) 875 MG tablet Take 1 tablet (875 mg total) by mouth 2 (two) times daily.   JANUMET XR [phone] MG TB24 TAKE ONE TABLET BY MOUTH ONCE A DAY   JARDIANCE 10 MG TABS tablet TAKE ONE TABLET BY MOUTH ONCE A DAY BEFORE BREAKFAST   metFORMIN (GLUCOPHAGE-XR) 500 MG 24 hr tablet TAKE TWO TABLET BY MOUTH DAILY WITH BREAKFAST   metoprolol succinate (TOPROL-XL) 50 MG 24 hr tablet TAKE ONE TABLET BY MOUTH ONCE A DAY   perphenazine (TRILAFON) 2 MG tablet Take 2 mg by mouth 2 (two) times daily.   [DISCONTINUED] albuterol (VENTOLIN HFA) 108 (90 Base) MCG/ACT inhaler Inhale 2 puffs into the lungs every 6 (six) hours as needed for wheezing or shortness of breath.   [DISCONTINUED] IMIPRAMINE HCL PO Take 1 capsule by mouth 3 (three) times daily.   [DISCONTINUED] ketoconazole (NIZORAL) 2 % cream Apply 1 Application topically daily.   [DISCONTINUED] lovastatin (MEVACOR) 40 MG tablet TAKE ONE TABLET BY MOUTH EVERY  NIGHT AT BEDTIME   tiotropium (SPIRIVA) 18 MCG inhalation capsule Place 1 capsule (18 mcg total) into inhaler and inhale 1 day or 1 dose for 1 dose.   No facility-administered medications prior to visit.    Review of Systems    Objective    BP 115/83   Pulse 78   Ht 5\' 6"  (1.676 m)   Wt 183 lb 6.4 oz (83.2 kg)   SpO2 99%   BMI 29.60 kg/m   Physical Exam Vitals and nursing note reviewed.  Constitutional:      Appearance: Normal appearance. He is overweight.  HENT:     Head: Normocephalic and atraumatic.  Cardiovascular:     Rate and Rhythm: Normal rate and regular rhythm.     Pulses: Normal pulses.     Heart sounds: Normal heart sounds.  Pulmonary:     Effort: Pulmonary effort is normal.     Breath sounds: Normal breath sounds.  Abdominal:     General: There is distension.     Palpations: Abdomen is soft.  Musculoskeletal:        General: Normal range of motion.     Cervical back: Normal range of motion.  Skin:    General: Skin is warm and dry.     Capillary Refill: Capillary refill takes less than 2 seconds.  Neurological:     General: No focal deficit present.  Mental Status: He is alert and oriented to person, place, and time. Mental status is at baseline.  Psychiatric:        Mood and Affect: Mood normal.        Behavior: Behavior normal.        Thought Content: Thought content normal.        Judgment: Judgment normal.      Results for orders placed or performed in visit on 03/06/23  HgB A1c  Result Value Ref Range   Hgb A1c MFr Bld 6.8 (H) 4.8 - 5.6 %   Est. average glucose Bld gHb Est-mCnc 148 mg/dL  Lipid Profile  Result Value Ref Range   Cholesterol, Total 122 100 - 199 mg/dL   Triglycerides 161 0 - 149 mg/dL   HDL 34 (L) >09 mg/dL   VLDL Cholesterol Cal 20 5 - 40 mg/dL   LDL Chol Calc (NIH) 68 0 - 99 mg/dL   Chol/HDL Ratio 3.6 0.0 - 5.0 ratio  Comprehensive metabolic panel  Result Value Ref Range   Glucose 102 (H) 70 - 99 mg/dL   BUN 10 8  - 27 mg/dL   Creatinine, Ser 6.04 0.76 - 1.27 mg/dL   eGFR 75 >54 UJ/WJX/9.14   BUN/Creatinine Ratio 9 (L) 10 - 24   Sodium 137 134 - 144 mmol/L   Potassium 5.0 3.5 - 5.2 mmol/L   Chloride 101 96 - 106 mmol/L   CO2 21 20 - 29 mmol/L   Calcium 9.6 8.6 - 10.2 mg/dL   Total Protein 6.7 6.0 - 8.5 g/dL   Albumin 4.2 3.9 - 4.9 g/dL   Globulin, Total 2.5 1.5 - 4.5 g/dL   Bilirubin Total 0.3 0.0 - 1.2 mg/dL   Alkaline Phosphatase 89 44 - 121 IU/L   AST 17 0 - 40 IU/L   ALT 14 0 - 44 IU/L  Urine Microalbumin w/creat. ratio  Result Value Ref Range   Creatinine, Urine 68.5 Not Estab. mg/dL   Microalbumin, Urine 5.4 Not Estab. ug/mL   Microalb/Creat Ratio 8 0 - 29 mg/g creat  CBC  Result Value Ref Range   WBC 8.1 3.4 - 10.8 x10E3/uL   RBC 4.77 4.14 - 5.80 x10E6/uL   Hemoglobin 14.7 13.0 - 17.7 g/dL   Hematocrit 78.2 95.6 - 51.0 %   MCV 91 79 - 97 fL   MCH 30.8 26.6 - 33.0 pg   MCHC 33.8 31.5 - 35.7 g/dL   RDW 21.3 08.6 - 57.8 %   Platelets 271 150 - 450 x10E3/uL  PSA  Result Value Ref Range   Prostate Specific Ag, Serum 5.1 (H) 0.0 - 4.0 ng/mL    Assessment & Plan     Problem List Items Addressed This Visit       Cardiovascular and Mediastinum   Hypertension associated with diabetes (HCC)    Chronic, borderline Goal 129/79 or less Repeat CBC and CMP Continues on metrop 50 mg xl daily Would benefit from start of lisinopril given DM      Relevant Orders   Comprehensive metabolic panel (Completed)   CBC (Completed)     Respiratory   Pulmonary emphysema (HCC)    Chronic, stable Recommend daily inhaler and tobacco cessation; pt declines Will refill PRN inhaler Continue to monitor      Relevant Medications   albuterol (VENTOLIN HFA) 108 (90 Base) MCG/ACT inhaler     Endocrine   Hyperlipidemia associated with type 2 diabetes mellitus (HCC)    Chronic, previously elevated  repeat LP recommend diet low in saturated fat and regular exercise - 30 min at least 5 times per  week Remains on mevacor 40 mg The ASCVD Risk score (Arnett DK, et al., 2019) failed to calculate for the following reasons:   The valid total cholesterol range is 130 to 320 mg/dL Goal LDL <14      Relevant Orders   Lipid Profile (Completed)   Type 2 diabetes mellitus with hyperglycemia (HCC) - Primary    Chronic, previously elevated; changed from metformin and now remains on janumet [phone] mg as well as jardiance 10 mg Continue to recommend balanced, lower carb meals. Smaller meal size, adding snacks. Choosing water as drink of choice and increasing purposeful exercise. Repeat A1c Declines completion of foot exam or referral for DM eye exam      Relevant Orders   HgB A1c (Completed)   Comprehensive metabolic panel (Completed)   Urine Microalbumin w/creat. ratio (Completed)   CBC (Completed)     Other   Colon cancer screening declined    Pt declines screening for colon cancer at this time       Frequency of micturition    Recommend labs; denies c/f UTI      Relevant Orders   PSA (Completed)   Lower urinary tract symptoms (LUTS)    Recommend PSA and urine micro       Relevant Orders   PSA (Completed)   Tobacco dependence    Chronic, stable Remains pre contemplative regarding cessation at this time despite known risk factors for heart attack and stroke       Relevant Orders   Ambulatory Referral Lung Cancer Screening [facility]   Return in about 6 months (around 09/06/2023) for annual examination.     Leilani Merl, FNP, have reviewed all documentation for this visit. The documentation on 03/11/23 for the exam, diagnosis, procedures, and orders are all accurate and complete.  Jacky Kindle, FNP  [facility] [phone] (phone) [phone] (fax)  [facility]

## 2023-03-07 ENCOUNTER — Other Ambulatory Visit: Payer: Self-pay | Admitting: Family Medicine

## 2023-03-07 DIAGNOSIS — R972 Elevated prostate specific antigen [PSA]: Secondary | ICD-10-CM | POA: Insufficient documentation

## 2023-03-07 NOTE — Progress Notes (Signed)
Improved urine micro

## 2023-03-07 NOTE — Progress Notes (Signed)
Improved diabetes; A1c is now at goal of <7%. Continue to recommend balanced, lower carb meals. Smaller meal size, adding snacks. Choosing water as drink of choice and increasing purposeful exercise.  Cholesterol is also at goal of LDL <70. I continue to recommend diet low in saturated fat and regular exercise - 30 min at least 5 times per week  PSA remains elevated; recommend f/u at urology for second opinion.  Urine pending.  All other labs normal and stable.  Continue to recommend colon cancer screening.

## 2023-03-11 DIAGNOSIS — Z5320 Procedure and treatment not carried out because of patient's decision for unspecified reasons: Secondary | ICD-10-CM | POA: Insufficient documentation

## 2023-03-11 DIAGNOSIS — R350 Frequency of micturition: Secondary | ICD-10-CM | POA: Insufficient documentation

## 2023-03-11 DIAGNOSIS — F17200 Nicotine dependence, unspecified, uncomplicated: Secondary | ICD-10-CM | POA: Insufficient documentation

## 2023-03-11 DIAGNOSIS — E119 Type 2 diabetes mellitus without complications: Secondary | ICD-10-CM | POA: Insufficient documentation

## 2023-03-11 DIAGNOSIS — R399 Unspecified symptoms and signs involving the genitourinary system: Secondary | ICD-10-CM | POA: Insufficient documentation

## 2023-03-11 NOTE — Assessment & Plan Note (Signed)
Recommend PSA and urine micro

## 2023-03-11 NOTE — Assessment & Plan Note (Signed)
Pt declines screening for colon cancer at this time

## 2023-03-11 NOTE — Assessment & Plan Note (Signed)
Chronic, stable Recommend daily inhaler and tobacco cessation; pt declines Will refill PRN inhaler Continue to monitor

## 2023-03-11 NOTE — Assessment & Plan Note (Addendum)
Chronic, previously elevated; changed from metformin and now remains on janumet [phone] mg as well as jardiance 10 mg Continue to recommend balanced, lower carb meals. Smaller meal size, adding snacks. Choosing water as drink of choice and increasing purposeful exercise. Repeat A1c Declines completion of foot exam or referral for DM eye exam

## 2023-03-11 NOTE — Assessment & Plan Note (Signed)
Chronic, stable Remains pre contemplative regarding cessation at this time despite known risk factors for heart attack and stroke

## 2023-03-11 NOTE — Assessment & Plan Note (Signed)
Chronic, borderline Goal 129/79 or less Repeat CBC and CMP Continues on metrop 50 mg xl daily Would benefit from start of lisinopril given DM

## 2023-03-11 NOTE — Assessment & Plan Note (Signed)
Chronic, previously elevated repeat LP recommend diet low in saturated fat and regular exercise - 30 min at least 5 times per week Remains on mevacor 40 mg The ASCVD Risk score (Arnett DK, et al., 2019) failed to calculate for the following reasons:   The valid total cholesterol range is 130 to 320 mg/dL Goal LDL <16

## 2023-03-11 NOTE — Assessment & Plan Note (Signed)
Recommend labs; denies c/f UTI

## 2023-04-22 ENCOUNTER — Other Ambulatory Visit: Payer: Self-pay | Admitting: Physician Assistant

## 2023-04-22 DIAGNOSIS — E1165 Type 2 diabetes mellitus with hyperglycemia: Secondary | ICD-10-CM

## 2023-05-07 ENCOUNTER — Ambulatory Visit: Payer: Medicare Other | Admitting: Dermatology

## 2023-05-16 ENCOUNTER — Other Ambulatory Visit: Payer: Self-pay | Admitting: Physician Assistant

## 2023-05-16 DIAGNOSIS — E1159 Type 2 diabetes mellitus with other circulatory complications: Secondary | ICD-10-CM

## 2023-06-11 ENCOUNTER — Ambulatory Visit: Payer: Medicare Other | Admitting: Dermatology

## 2023-06-27 ENCOUNTER — Telehealth: Payer: Self-pay

## 2023-06-27 NOTE — Telephone Encounter (Signed)
Copied from CRM [id number]. Topic: Appointments - Other >> Jun 27, 2023 12:04 PM Phill Myron wrote: [employer] call would like to set up a "Welcome to Medicare" visit.

## 2023-07-18 ENCOUNTER — Encounter: Payer: Self-pay | Admitting: Family Medicine

## 2023-07-18 ENCOUNTER — Ambulatory Visit (INDEPENDENT_AMBULATORY_CARE_PROVIDER_SITE_OTHER): Payer: Medicare Other | Admitting: Family Medicine

## 2023-07-18 VITALS — BP 98/77 | HR 87 | Ht 66.0 in | Wt 177.6 lb

## 2023-07-18 DIAGNOSIS — I152 Hypertension secondary to endocrine disorders: Secondary | ICD-10-CM

## 2023-07-18 DIAGNOSIS — Z0000 Encounter for general adult medical examination without abnormal findings: Secondary | ICD-10-CM | POA: Diagnosis not present

## 2023-07-18 DIAGNOSIS — E1159 Type 2 diabetes mellitus with other circulatory complications: Secondary | ICD-10-CM

## 2023-07-18 DIAGNOSIS — R972 Elevated prostate specific antigen [PSA]: Secondary | ICD-10-CM

## 2023-07-18 DIAGNOSIS — E1165 Type 2 diabetes mellitus with hyperglycemia: Secondary | ICD-10-CM | POA: Diagnosis not present

## 2023-07-18 DIAGNOSIS — J432 Centrilobular emphysema: Secondary | ICD-10-CM

## 2023-07-18 DIAGNOSIS — Z5320 Procedure and treatment not carried out because of patient's decision for unspecified reasons: Secondary | ICD-10-CM

## 2023-07-18 DIAGNOSIS — Z23 Encounter for immunization: Secondary | ICD-10-CM | POA: Diagnosis not present

## 2023-07-18 DIAGNOSIS — E785 Hyperlipidemia, unspecified: Secondary | ICD-10-CM

## 2023-07-18 DIAGNOSIS — E1169 Type 2 diabetes mellitus with other specified complication: Secondary | ICD-10-CM

## 2023-07-18 DIAGNOSIS — I459 Conduction disorder, unspecified: Secondary | ICD-10-CM

## 2023-07-18 DIAGNOSIS — F17200 Nicotine dependence, unspecified, uncomplicated: Secondary | ICD-10-CM

## 2023-07-18 NOTE — Progress Notes (Signed)
Annual Wellness Visit   Patient: Luis Woods, Male    DOB: 10-13-1955, 67 y.o.   MRN: [id number] Visit Date: 07/18/2023  Today's Provider: Jacky Kindle, FNP   Introduced to nurse practitioner role and practice setting.  All questions answered.  Discussed provider/patient relationship and expectations.  Chief Complaint  Patient presents with   Medicare Wellness   Subjective    Luis Woods is a 67 y.o. male who presents today for his Annual Wellness Visit. He reports consuming a general diet. The patient does not participate in regular exercise at present. He generally feels fairly well. He reports sleeping fairly well. He does have additional problems to discuss today.   HPI  Pt presents alone for AWV.  Medicare EKG taken- EKG: normal sinus rhythm, RBBB. HR 84 with IVCD/R BBB No comparison Hx of HTN, DM, COPD, Aortic Atherosclerosis   Medications: Outpatient Medications Prior to Visit  Medication Sig   albuterol (VENTOLIN HFA) 108 (90 Base) MCG/ACT inhaler Inhale 2 puffs into the lungs every 6 (six) hours as needed for wheezing or shortness of breath.   JANUMET XR [phone] MG TB24 TAKE ONE TABLET BY MOUTH ONCE A DAY   JARDIANCE 10 MG TABS tablet TAKE ONE TABLET BY MOUTH ONCE A DAY BEFORE BREAKFAST   metoprolol succinate (TOPROL-XL) 50 MG 24 hr tablet TAKE ONE TABLET BY MOUTH ONCE A DAY   perphenazine (TRILAFON) 2 MG tablet Take 2 mg by mouth 2 (two) times daily.   [DISCONTINUED] metFORMIN (GLUCOPHAGE-XR) 500 MG 24 hr tablet TAKE TWO TABLET BY MOUTH DAILY WITH BREAKFAST   tiotropium (SPIRIVA) 18 MCG inhalation capsule Place 1 capsule (18 mcg total) into inhaler and inhale 1 day or 1 dose for 1 dose.   [DISCONTINUED] amoxicillin (AMOXIL) 875 MG tablet Take 1 tablet (875 mg total) by mouth 2 (two) times daily.   No facility-administered medications prior to visit.    Allergies  Allergen Reactions   Sulfa Antibiotics Itching   Klonopin [Clonazepam] Rash    Patient Care Team: Jacky Kindle, FNP as PCP - General (Family Medicine)  Last CBC Lab Results  Component Value Date   WBC 8.1 03/06/2023   HGB 14.7 03/06/2023   HCT 43.5 03/06/2023   MCV 91 03/06/2023   MCH 30.8 03/06/2023   RDW 13.1 03/06/2023   PLT 271 03/06/2023   Last metabolic panel Lab Results  Component Value Date   GLUCOSE 102 (H) 03/06/2023   NA 137 03/06/2023   K 5.0 03/06/2023   CL 101 03/06/2023   CO2 21 03/06/2023   BUN 10 03/06/2023   CREATININE 1.09 03/06/2023   EGFR 75 03/06/2023   CALCIUM 9.6 03/06/2023   PROT 6.7 03/06/2023   ALBUMIN 4.2 03/06/2023   LABGLOB 2.5 03/06/2023   AGRATIO 1.7 02/13/2022   BILITOT 0.3 03/06/2023   ALKPHOS 89 03/06/2023   AST 17 03/06/2023   ALT 14 03/06/2023   Last lipids Lab Results  Component Value Date   CHOL 122 03/06/2023   HDL 34 (L) 03/06/2023   LDLCALC 68 03/06/2023   TRIG 104 03/06/2023   CHOLHDL 3.6 03/06/2023   Last hemoglobin A1c Lab Results  Component Value Date   HGBA1C 6.8 (H) 03/06/2023   Last thyroid functions Lab Results  Component Value Date   TSH 2.520 05/18/2020   Last vitamin D No results found for: "25OHVITD2", "25OHVITD3", "VD25OH" Last vitamin B12 and Folate No results found for: "VITAMINB12", "FOLATE"  Objective    Vitals: BP 98/77 (BP Location: Left Arm, Patient Position: Sitting, Cuff Size: Normal)   Pulse 87   Ht 5\' 6"  (1.676 m)   Wt 177 lb 9.6 oz (80.6 kg)   SpO2 97%   BMI 28.67 kg/m   BP Readings from Last 3 Encounters:  07/18/23 98/77  03/06/23 115/83  06/19/22 102/79   Wt Readings from Last 3 Encounters:  07/18/23 177 lb 9.6 oz (80.6 kg)  03/06/23 183 lb 6.4 oz (83.2 kg)  06/19/22 185 lb (83.9 kg)   SpO2 Readings from Last 3 Encounters:  07/18/23 97%  03/06/23 99%  06/19/22 98%   Physical Exam Vitals and nursing note reviewed.  Constitutional:      Appearance: Normal appearance. He is normal weight.  HENT:     Head: Normocephalic and  atraumatic.  Eyes:     Extraocular Movements: Extraocular movements intact.     Conjunctiva/sclera: Conjunctivae normal.  Cardiovascular:     Rate and Rhythm: Normal rate and regular rhythm.     Pulses: Normal pulses.     Heart sounds: Normal heart sounds.  Pulmonary:     Effort: Pulmonary effort is normal.     Breath sounds: Normal breath sounds.  Musculoskeletal:        General: Normal range of motion.     Cervical back: Normal range of motion.  Skin:    General: Skin is warm and dry.     Capillary Refill: Capillary refill takes less than 2 seconds.  Neurological:     General: No focal deficit present.     Mental Status: He is alert and oriented to person, place, and time. Mental status is at baseline.  Psychiatric:        Mood and Affect: Mood normal.        Behavior: Behavior normal.        Thought Content: Thought content normal.        Judgment: Judgment normal.    Most recent functional status assessment:    07/18/2023    3:05 PM  In your present state of health, do you have any difficulty performing the following activities:  Hearing? 0  Vision? 1  Comment aura around lights  Difficulty concentrating or making decisions? 0  Walking or climbing stairs? 0  Dressing or bathing? 0  Doing errands, shopping? 0  Preparing Food and eating ? N  Using the Toilet? N  In the past six months, have you accidently leaked urine? N  Do you have problems with loss of bowel control? N  Managing your Medications? N  Housekeeping or managing your Housekeeping? N   Most recent fall risk assessment:    07/18/2023    2:42 PM  Fall Risk   Falls in the past year? 0  Number falls in past yr: 0  Injury with Fall? 0    Most recent depression screenings:    07/18/2023    2:43 PM 03/06/2023    1:27 PM  PHQ 2/9 Scores  PHQ - 2 Score 0 0   Most recent cognitive screening:    07/18/2023    2:43 PM  6CIT Screen  What Year? 0 points  What month? 0 points  What time? 0 points   Count back from 20 0 points  Months in reverse 0 points   Most recent Audit-C alcohol use screening    06/17/2022   11:18 AM  Alcohol Use Disorder Test (AUDIT)  Patient refused Alcohol Screening Tool Yes  Alcohol Brief Interventions/Follow-up Patient Refused   A score of 3 or more in women, and 4 or more in men indicates increased risk for alcohol abuse, EXCEPT if all of the points are from question 1   No results found for any visits on 07/18/23.  Assessment & Plan     Annual wellness visit done today including the all of the following: Reviewed patient's Family Medical History Reviewed and updated list of patient's medical providers Assessment of cognitive impairment was done Assessed patient's functional ability Established a written schedule for health screening services Health Risk Assessent Completed and Reviewed  Exercise Activities and Dietary recommendations  Goals   None     Immunization History  Administered Date(s) Administered   Fluad Trivalent(High Dose 65+) 07/18/2023   Influenza,inj,Quad PF,6+ Mos 04/28/2019, 05/18/2020, 06/17/2022   Influenza-Unspecified 04/12/2021   PFIZER(Purple Top)SARS-COV-2 Vaccination 11/16/2019, 12/07/2019   PNEUMOCOCCAL CONJUGATE-20 07/18/2023   Pneumococcal Polysaccharide-23 04/28/2019   Tdap 05/18/2020   Zoster Recombinant(Shingrix) 11/07/2021    Health Maintenance  Topic Date Due   Fecal DNA (Cologuard)  Never done   Lung Cancer Screening  05/31/2020   FOOT EXAM  05/18/2021   Zoster Vaccines- Shingrix (2 of 2) 01/02/2022   OPHTHALMOLOGY EXAM  02/20/2023   COVID-19 Vaccine (3 - 2024-25 season) 04/13/2023   HEMOGLOBIN A1C  09/06/2023   Diabetic kidney evaluation - eGFR measurement  03/05/2024   Diabetic kidney evaluation - Urine ACR  03/05/2024   Medicare Annual Wellness (AWV)  07/24/2024   DTaP/Tdap/Td (2 - Td or Tdap) 05/18/2030   Pneumonia Vaccine 50+ Years old  Completed   INFLUENZA VACCINE  Completed    Hepatitis C Screening  Completed   HIV Screening  Completed   HPV VACCINES  Aged Out     Discussed health benefits of physical activity, and encouraged him to engage in regular exercise appropriate for his age and condition.    Problem List Items Addressed This Visit       Cardiovascular and Mediastinum   Acquired heart block   Noted on EKG Referral to cardiology Pt without complaints       Relevant Orders   Ambulatory referral to Cardiology   Hypertension associated with diabetes (HCC)   Chronic, stable At goal Continues on toprol 50 mg and jardiance 10 mg Not on ACE or ARB- consider low dose lisinopril at 2.5 mg to assist kidney function       Relevant Orders   EKG 12-Lead     Respiratory   Pulmonary emphysema (HCC)   Chronic, stable Continues on albuterol PRN Also on spiriva daily Tobacco cessation encouraged; pt remains pre contemplative        Endocrine   Hyperlipidemia associated with type 2 diabetes mellitus (HCC)   Chronic, LDL goal remains 55 Consider start of statin to assist The ASCVD Risk score (Arnett DK, et al., 2019) failed to calculate for the following reasons:   The valid total cholesterol range is 130 to 320 mg/dL       Type 2 diabetes mellitus with hyperglycemia (HCC)   Chronic, previously at goal <7% Continue to recommend balanced, lower carb meals. Smaller meal size, adding snacks. Choosing water as drink of choice and increasing purposeful exercise.         Other   Colon cancer screening declined   Elevated PSA   Denies LUTS; DRE declined. However, if PSA remains elevated pt will be referred to urology for DRE and next steps for best treatment.  Relevant Orders   Ambulatory referral to Urology   Encounter for immunization   Relevant Orders   Flu Vaccine Trivalent High Dose (Fluad) (Completed)   Pneumococcal conjugate vaccine 20-valent (Completed)   Encounter for initial annual wellness visit (AWV) in Medicare patient -  Primary   Things to do to keep yourself healthy  - Exercise at least 30-45 minutes a day, 3-4 days a week.  - Eat a low-fat diet with lots of fruits and vegetables, up to 7-9 servings per day.  - Seatbelts can save your life. Wear them always.  - Smoke detectors on every level of your home, check batteries every year.  - Eye Doctor - have an eye exam every 1-2 years  - Safe sex - if you may be exposed to STDs, use a condom.  - Alcohol -  If you drink, do it moderately, less than 2 drinks per day.  - Health Care Power of Attorney. Choose someone to speak for you if you are not able.  - Depression is common in our stressful world.If you're feeling down or losing interest in things you normally enjoy, please come in for a visit.  - Violence - If anyone is threatening or hurting you, please call immediately.       Flu vaccine need   Need for prophylactic vaccination against Streptococcus pneumoniae (pneumococcus)   Tobacco dependence   Chronic, stable Remains pre contemplative at this time Continue to monitor       Relevant Orders   Ambulatory Referral Lung Cancer Screening [facility]   Return in about 4 weeks (around 08/15/2023) for chonic disease management.    Leilani Merl, FNP, have reviewed all documentation for this visit. The documentation on 07/25/23 for the exam, diagnosis, procedures, and orders are all accurate and complete.  Jacky Kindle, FNP  [facility] [phone] (phone) [phone] (fax)  [facility]

## 2023-07-21 ENCOUNTER — Other Ambulatory Visit: Payer: Self-pay | Admitting: Physician Assistant

## 2023-07-21 DIAGNOSIS — E119 Type 2 diabetes mellitus without complications: Secondary | ICD-10-CM

## 2023-07-22 NOTE — Telephone Encounter (Signed)
Requested Prescriptions  Pending Prescriptions Disp Refills   lovastatin (MEVACOR) 40 MG tablet [Pharmacy Med Name: LOVASTATIN 40MG  TABLET] 90 tablet 1    Sig: TAKE ONE TABLET BY MOUTH EVERY NIGHT AT BEDTIME     Cardiovascular:  Antilipid - Statins 2 Failed - 07/21/2023  2:11 PM      Failed - Lipid Panel in normal range within the last 12 months    Cholesterol, Total  Date Value Ref Range Status  03/06/2023 122 100 - 199 mg/dL Final   LDL Chol Calc (NIH)  Date Value Ref Range Status  03/06/2023 68 0 - 99 mg/dL Final   HDL  Date Value Ref Range Status  03/06/2023 34 (L) >39 mg/dL Final   Triglycerides  Date Value Ref Range Status  03/06/2023 104 0 - 149 mg/dL Final         Passed - Cr in normal range and within 360 days    Creatinine, Ser  Date Value Ref Range Status  03/06/2023 1.09 0.76 - 1.27 mg/dL Final         Passed - Patient is not pregnant      Passed - Valid encounter within last 12 months    Recent Outpatient Visits           4 days ago Colon cancer screening declined   [facility] Merita Norton T, FNP   4 months ago Type 2 diabetes mellitus with hyperglycemia, without long-term current use of insulin (HCC)   [place] [facility] Merita Norton T, FNP   1 year ago Abscess   [facility] Alfredia Ferguson, PA-C   1 year ago Type 2 diabetes mellitus with hyperglycemia, without long-term current use of insulin (HCC)   [place] [facility] Alfredia Ferguson, PA-C   1 year ago Type 2 diabetes mellitus with hyperglycemia, without long-term current use of insulin (HCC)   [facility] Alfredia Ferguson, PA-C       Future Appointments             In 3 weeks Jacky Kindle, FNP [facility]             metFORMIN (GLUCOPHAGE-XR) 500 MG 24 hr tablet [Pharmacy Med Name: METFORMIN HYDROCHLORIDE ER 500MG  ER TABLET ER 24HR] 180  tablet 1    Sig: TAKE TWO TABLET BY MOUTH DAILY WITH BREAKFAST     Endocrinology:  Diabetes - Biguanides Failed - 07/21/2023  2:11 PM      Failed - B12 Level in normal range and within 720 days    No results found for: "VITAMINB12"       Failed - CBC within normal limits and completed in the last 12 months    WBC  Date Value Ref Range Status  03/06/2023 8.1 3.4 - 10.8 x10E3/uL Final   RBC  Date Value Ref Range Status  03/06/2023 4.77 4.14 - 5.80 x10E6/uL Final   Hemoglobin  Date Value Ref Range Status  03/06/2023 14.7 13.0 - 17.7 g/dL Final   Hematocrit  Date Value Ref Range Status  03/06/2023 43.5 37.5 - 51.0 % Final   MCHC  Date Value Ref Range Status  03/06/2023 33.8 31.5 - 35.7 g/dL Final   [facility]  Date Value Ref Range Status  03/06/2023 30.8 26.6 - 33.0 pg Final   MCV  Date Value Ref Range Status  03/06/2023 91 79 - 97 fL Final   No  results found for: "PLTCOUNTKUC", "LABPLAT", "POCPLA" RDW  Date Value Ref Range Status  03/06/2023 13.1 11.6 - 15.4 % Final         Passed - Cr in normal range and within 360 days    Creatinine, Ser  Date Value Ref Range Status  03/06/2023 1.09 0.76 - 1.27 mg/dL Final         Passed - HBA1C is between 0 and 7.9 and within 180 days    Hgb A1c MFr Bld  Date Value Ref Range Status  03/06/2023 6.8 (H) 4.8 - 5.6 % Final    Comment:             Prediabetes: 5.7 - 6.4          Diabetes: >6.4          Glycemic control for adults with diabetes: <7.0          Passed - eGFR in normal range and within 360 days    GFR calc Af Amer  Date Value Ref Range Status  05/18/2020 82 >59 mL/min/1.73 Final    Comment:    **[facility] currently reports eGFR in compliance with the current**   recommendations of the [employer]. [facility] will   update reporting as new guidelines are published from the NKF-ASN   Task force.    GFR calc non Af Amer  Date Value Ref Range Status  05/18/2020 71 >59 mL/min/1.73 Final   eGFR  Date  Value Ref Range Status  03/06/2023 75 >59 mL/min/1.73 Final         Passed - Valid encounter within last 6 months    Recent Outpatient Visits           4 days ago Colon cancer screening declined   [facility] Merita Norton T, FNP   4 months ago Type 2 diabetes mellitus with hyperglycemia, without long-term current use of insulin [facility])   [facility] [facility] Jacky Kindle, FNP   1 year ago Abscess   [facility] Alfredia Ferguson, PA-C   1 year ago Type 2 diabetes mellitus with hyperglycemia, without long-term current use of insulin [facility])   [facility] Alfredia Ferguson, PA-C   1 year ago Type 2 diabetes mellitus with hyperglycemia, without long-term current use of insulin [facility])    [facility] Alfredia Ferguson, PA-C       Future Appointments             In 3 weeks Jacky Kindle, FNP [facility]

## 2023-07-23 ENCOUNTER — Ambulatory Visit: Payer: Self-pay

## 2023-07-23 NOTE — Telephone Encounter (Signed)
Chief Complaint: General Questions  Disposition: [] ED /[] Urgent Care (no appt availability in office) / [] Appointment(In office/virtual)/ []  [place] [facility]/ [] Home Care/ [] Refused Recommended Disposition /[] [facility] [facility]/ [x]  Follow-up with PCP Additional Notes: Patient's sister Darel Hong (signed DPR on file) asking what happened during the patient's medicare wellness appointment on 07/18/23. Darel Hong was not able to attend the appointment like she usually does and her husband was there. Darel Hong asked if the patient had an EKG done and why was a referral placed to Cardiology. Darel Hong also wanted to know will the patient need to be fasting for his January follow appointment since he did not have lab work don't at the visit on 07/18/23. Darel Hong stated her brother already had a Flu vaccine at the pharmacy and he received one at the 07/18/23 visit as well. Care advice was given and advised Darel Hong I would forward questions to PCP due to the visit note not being completed.   Summary: Questions for a nurse   Pt's brother in law called reporting that his wife (the pt's sister, on [place]) has questions for a nurse about the patient's wellbeing. They state the patient is not forthcoming about his health and they are seeking more understanding.     Reason for Disposition  [1] Caller requesting NON-URGENT health information AND [2] PCP's office is the best resource  Answer Assessment - Initial Assessment Questions 1. REASON FOR CALL or QUESTION: "What is your reason for calling today?" or "How can I best help you?" or "What question do you have that I can help answer?"     What is going on with my brother's health.  Protocols used: Information Only Call - No Triage-A-AH

## 2023-07-25 ENCOUNTER — Encounter: Payer: Self-pay | Admitting: Family Medicine

## 2023-07-25 DIAGNOSIS — I459 Conduction disorder, unspecified: Secondary | ICD-10-CM | POA: Insufficient documentation

## 2023-07-25 DIAGNOSIS — Z23 Encounter for immunization: Secondary | ICD-10-CM | POA: Insufficient documentation

## 2023-07-25 DIAGNOSIS — Z0000 Encounter for general adult medical examination without abnormal findings: Secondary | ICD-10-CM | POA: Insufficient documentation

## 2023-07-25 NOTE — Assessment & Plan Note (Signed)
Denies LUTS; DRE declined. However, if PSA remains elevated pt will be referred to urology for DRE and next steps for best treatment.

## 2023-07-25 NOTE — Assessment & Plan Note (Signed)
Noted on EKG Referral to cardiology Pt without complaints

## 2023-07-25 NOTE — Assessment & Plan Note (Signed)
Chronic, stable Remains pre contemplative at this time Continue to monitor

## 2023-07-25 NOTE — Assessment & Plan Note (Signed)
Chronic, stable Continues on albuterol PRN Also on spiriva daily Tobacco cessation encouraged; pt remains pre contemplative

## 2023-07-25 NOTE — Assessment & Plan Note (Signed)
Chronic, stable At goal Continues on toprol 50 mg and jardiance 10 mg Not on ACE or ARB- consider low dose lisinopril at 2.5 mg to assist kidney function

## 2023-07-25 NOTE — Assessment & Plan Note (Signed)

## 2023-07-25 NOTE — Assessment & Plan Note (Addendum)
Chronic, previously at goal <7% Continue to recommend balanced, lower carb meals. Smaller meal size, adding snacks. Choosing water as drink of choice and increasing purposeful exercise.

## 2023-07-25 NOTE — Assessment & Plan Note (Signed)
Chronic, LDL goal remains 55 Consider start of statin to assist The ASCVD Risk score (Arnett DK, et al., 2019) failed to calculate for the following reasons:   The valid total cholesterol range is 130 to 320 mg/dL

## 2023-08-15 ENCOUNTER — Ambulatory Visit: Payer: Medicare Other | Admitting: Family Medicine

## 2023-08-15 ENCOUNTER — Ambulatory Visit (INDEPENDENT_AMBULATORY_CARE_PROVIDER_SITE_OTHER): Payer: Medicare Other | Admitting: Family Medicine

## 2023-08-15 VITALS — BP 113/64 | Ht 66.0 in | Wt 180.0 lb

## 2023-08-15 DIAGNOSIS — I152 Hypertension secondary to endocrine disorders: Secondary | ICD-10-CM | POA: Diagnosis not present

## 2023-08-15 DIAGNOSIS — I459 Conduction disorder, unspecified: Secondary | ICD-10-CM

## 2023-08-15 DIAGNOSIS — J432 Centrilobular emphysema: Secondary | ICD-10-CM | POA: Diagnosis not present

## 2023-08-15 DIAGNOSIS — R972 Elevated prostate specific antigen [PSA]: Secondary | ICD-10-CM

## 2023-08-15 DIAGNOSIS — F17200 Nicotine dependence, unspecified, uncomplicated: Secondary | ICD-10-CM

## 2023-08-15 DIAGNOSIS — E1159 Type 2 diabetes mellitus with other circulatory complications: Secondary | ICD-10-CM

## 2023-08-15 DIAGNOSIS — Z5320 Procedure and treatment not carried out because of patient's decision for unspecified reasons: Secondary | ICD-10-CM

## 2023-08-15 DIAGNOSIS — E1165 Type 2 diabetes mellitus with hyperglycemia: Secondary | ICD-10-CM | POA: Diagnosis not present

## 2023-08-15 DIAGNOSIS — R399 Unspecified symptoms and signs involving the genitourinary system: Secondary | ICD-10-CM

## 2023-08-15 DIAGNOSIS — E43 Unspecified severe protein-calorie malnutrition: Secondary | ICD-10-CM | POA: Insufficient documentation

## 2023-08-15 DIAGNOSIS — F209 Schizophrenia, unspecified: Secondary | ICD-10-CM

## 2023-08-15 DIAGNOSIS — E1169 Type 2 diabetes mellitus with other specified complication: Secondary | ICD-10-CM | POA: Diagnosis not present

## 2023-08-15 DIAGNOSIS — K436 Other and unspecified ventral hernia with obstruction, without gangrene: Secondary | ICD-10-CM

## 2023-08-15 MED ORDER — ROSUVASTATIN CALCIUM 40 MG PO TABS: 40.0000 mg | ORAL_TABLET | Freq: Every day | ORAL | 4 refills | Status: DC

## 2023-08-15 MED ORDER — LISINOPRIL 2.5 MG PO TABS: 2.5000 mg | ORAL_TABLET | Freq: Every day | ORAL | 4 refills | Status: DC

## 2023-08-15 NOTE — Assessment & Plan Note (Signed)
 Noted at [facility] appt; upcoming appt with [facility]

## 2023-08-15 NOTE — Progress Notes (Signed)
 Established patient visit  Patient: Luis Woods   DOB: [birth year]   68 y.o. Male  MRN: [id number] Visit Date: 08/15/2023  Today's healthcare provider: Kelly ONEIDA Cedar, FNP  Introduced to nurse practitioner role and practice setting.  All questions answered.  Discussed provider/patient relationship and expectations.  Chief Complaint  Patient presents with   Follow-up   Subjective    HPI   4 week follow up; chronic health and labs  Medications: Outpatient Medications Prior to Visit  Medication Sig   albuterol  (VENTOLIN  HFA) 108 (90 Base) MCG/ACT inhaler Inhale 2 puffs into the lungs every 6 (six) hours as needed for wheezing or shortness of breath.   FLUoxetine (PROZAC) 20 MG capsule Take 20 mg by mouth daily.   imipramine (TOFRANIL) 50 MG tablet Take 50 mg by mouth at bedtime.   JARDIANCE  10 MG TABS tablet TAKE ONE TABLET BY MOUTH ONCE A DAY BEFORE BREAKFAST   metFORMIN  (GLUCOPHAGE -XR) 500 MG 24 hr tablet TAKE TWO TABLET BY MOUTH DAILY WITH BREAKFAST   metoprolol  succinate (TOPROL -XL) 50 MG 24 hr tablet TAKE ONE TABLET BY MOUTH ONCE A DAY   perphenazine (TRILAFON) 2 MG tablet Take 2 mg by mouth 2 (two) times daily.   [DISCONTINUED] JANUMET  XR [phone] MG TB24 TAKE ONE TABLET BY MOUTH ONCE A DAY   [DISCONTINUED] lovastatin  (MEVACOR ) 40 MG tablet Take 40 mg by mouth daily.   tiotropium (SPIRIVA ) 18 MCG inhalation capsule Place 1 capsule (18 mcg total) into inhaler and inhale 1 day or 1 dose for 1 dose.   No facility-administered medications prior to visit.   Last CBC Lab Results  Component Value Date   WBC 8.1 03/06/2023   HGB 14.7 03/06/2023   HCT 43.5 03/06/2023   MCV 91 03/06/2023   MCH 30.8 03/06/2023   RDW 13.1 03/06/2023   PLT 271 03/06/2023   Last metabolic panel Lab Results  Component Value Date   GLUCOSE 102 (H) 03/06/2023   NA 137 03/06/2023   K 5.0 03/06/2023   CL 101 03/06/2023   CO2 21 03/06/2023   BUN 10 03/06/2023   CREATININE 1.09 03/06/2023    EGFR 75 03/06/2023   CALCIUM  9.6 03/06/2023   PROT 6.7 03/06/2023   ALBUMIN 4.2 03/06/2023   LABGLOB 2.5 03/06/2023   AGRATIO 1.7 02/13/2022   BILITOT 0.3 03/06/2023   ALKPHOS 89 03/06/2023   AST 17 03/06/2023   ALT 14 03/06/2023   Last lipids Lab Results  Component Value Date   CHOL 122 03/06/2023   HDL 34 (L) 03/06/2023   LDLCALC 68 03/06/2023   TRIG 104 03/06/2023   CHOLHDL 3.6 03/06/2023   Last hemoglobin A1c Lab Results  Component Value Date   HGBA1C 6.8 (H) 03/06/2023   Last thyroid functions Lab Results  Component Value Date   TSH 2.520 05/18/2020      Objective    BP 113/64 (BP Location: Left Arm, Patient Position: Sitting, Cuff Size: Normal)   Ht 5' 6 (1.676 m)   Wt 180 lb (81.6 kg)   SpO2 97%   BMI 29.05 kg/m   BP Readings from Last 3 Encounters:  08/15/23 113/64  07/18/23 98/77  03/06/23 115/83   Wt Readings from Last 3 Encounters:  08/15/23 180 lb (81.6 kg)  07/18/23 177 lb 9.6 oz (80.6 kg)  03/06/23 183 lb 6.4 oz (83.2 kg)   SpO2 Readings from Last 3 Encounters:  08/15/23 97%  07/18/23 97%  03/06/23 99%  Physical Exam Vitals and nursing note reviewed.  Constitutional:      Appearance: Normal appearance. He is overweight.  HENT:     Head: Normocephalic and atraumatic.  Cardiovascular:     Rate and Rhythm: Normal rate and regular rhythm.     Pulses: Normal pulses.     Heart sounds: Normal heart sounds.  Pulmonary:     Effort: Pulmonary effort is normal.     Breath sounds: Normal breath sounds.  Musculoskeletal:        General: Normal range of motion.     Cervical back: Normal range of motion.  Skin:    General: Skin is warm and dry.     Capillary Refill: Capillary refill takes less than 2 seconds.  Neurological:     General: No focal deficit present.     Mental Status: He is alert and oriented to person, place, and time. Mental status is at baseline.  Psychiatric:        Mood and Affect: Mood normal.        Behavior:  Behavior normal.        Thought Content: Thought content normal.        Judgment: Judgment normal.     No results found for any visits on 08/15/23.  Assessment & Plan     Problem List Items Addressed This Visit       Cardiovascular and Mediastinum   Acquired heart block   Noted at [facility] appt; upcoming appt with [facility]       Relevant Medications   lisinopril  (ZESTRIL ) 2.5 MG tablet   rosuvastatin  (CRESTOR ) 40 MG tablet   Other Relevant Orders   CBC with Differential/Platelet   Comprehensive Metabolic Panel (CMET)   TSH   Lipid panel   PSA   Vitamin D  (25 hydroxy)   Urine Microalbumin w/creat. ratio   Hemoglobin A1c   CT CARDIAC SCORING (SELF PAY ONLY)   Hypertension associated with diabetes (HCC) - Primary   Chronic, stable Start low dose ACE to assist Toprol  50 mg      Relevant Medications   lisinopril  (ZESTRIL ) 2.5 MG tablet   rosuvastatin  (CRESTOR ) 40 MG tablet   Other Relevant Orders   CBC with Differential/Platelet   Comprehensive Metabolic Panel (CMET)   TSH   Lipid panel   PSA   Vitamin D  (25 hydroxy)   Urine Microalbumin w/creat. ratio   Hemoglobin A1c   CT CARDIAC SCORING (SELF PAY ONLY)     Respiratory   Pulmonary emphysema (HCC)   Chronic, stable Pt endorses some SOB/DOE with bending/lifting; however, continues to smoke 1ppd 50+ years Previously on spiriva  and PRN albuterol  to assist      Relevant Orders   CBC with Differential/Platelet   Comprehensive Metabolic Panel (CMET)   TSH   Lipid panel   PSA   Vitamin D  (25 hydroxy)   Urine Microalbumin w/creat. 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 Start crestor  40 to replace previous  mevacor        Relevant Medications   lisinopril  (ZESTRIL ) 2.5 MG tablet   rosuvastatin  (CRESTOR ) 40 MG tablet   Other Relevant Orders   CBC with Differential/Platelet   Comprehensive Metabolic Panel (CMET)   TSH   Lipid panel   PSA   Vitamin D  (25 hydroxy)   Urine Microalbumin w/creat. ratio   Hemoglobin A1c   CT CARDIAC SCORING (SELF PAY ONLY)   Type 2 diabetes mellitus with hyperglycemia (HCC)   Chronic, repeat A1c Was on janumet  xr [phone] mg; however, no longer covered with insurance Recommend start of jardiance  10 with metformin  1000 mg BID      Relevant Medications   lisinopril  (ZESTRIL ) 2.5 MG tablet   rosuvastatin  (CRESTOR ) 40 MG tablet   Other Relevant Orders   CBC with Differential/Platelet   Comprehensive Metabolic Panel (CMET)   TSH   Lipid panel   PSA   Vitamin D  (25 hydroxy)   Urine Microalbumin w/creat. ratio   Hemoglobin A1c   CT CARDIAC SCORING (SELF PAY ONLY)     Other   Colon cancer screening declined   Elevated PSA   Chronic LUTS, with elevated PSA Referral in for urology Repeat PSA pending      Relevant Orders   CBC with Differential/Platelet   Comprehensive Metabolic Panel (CMET)   TSH   Lipid panel   PSA   Vitamin D  (25 hydroxy)   Urine Microalbumin w/creat. ratio   Hemoglobin A1c   Lower urinary tract symptoms (LUTS)   Chronic, with elevated PSA Referral in for urology Repeat PSA pending      Relevant Orders   CBC with Differential/Platelet   Comprehensive Metabolic Panel (CMET)   TSH   Lipid panel   PSA   Vitamin D  (25 hydroxy)   Urine Microalbumin w/creat. ratio   Hemoglobin A1c   Schizophrenia (HCC)   Chronic, stable Followed by mental health provider On 3 medications per med rec      Tobacco dependence    Chronic, stable Brief discussion regarding risks of tobacco/nicotine use and recommendations on ways to reduce use and work towards cessation of use of tobacco/nicotine products. Encouraged to use 1-800-QUIT-NOW.       Relevant Medications   rosuvastatin  (CRESTOR ) 40 MG tablet   Other Relevant Orders   CBC with Differential/Platelet   Comprehensive Metabolic Panel (CMET)   TSH   Lipid panel   PSA   Vitamin D  (25 hydroxy)   Urine Microalbumin w/creat. ratio   Hemoglobin A1c   CT CARDIAC SCORING (SELF PAY ONLY)   Unspecified severe protein-calorie malnutrition (HCC)   Recommend Vit D testing given hx of mental illness and multiple co morbid conditions      Relevant Orders   Vitamin D  (25 hydroxy)   CT CARDIAC SCORING (SELF PAY ONLY)   No follow-ups on file.     LILLETTE Kelly ONEIDA Emilio, FNP, have reviewed all documentation for this visit. The documentation on 08/15/23 for the exam, diagnosis, procedures, and orders are all accurate and complete.  Kelly ONEIDA Emilio, FNP  [facility] [phone] (phone) [phone] (fax)  [facility]

## 2023-08-15 NOTE — Assessment & Plan Note (Signed)
 Chronic, stable Pt endorses some SOB/DOE with bending/lifting; however, continues to smoke 1ppd 50+ years Previously on spiriva and PRN albuterol to assist

## 2023-08-15 NOTE — Assessment & Plan Note (Signed)
 Chronic, recommend LDL at <50 given extensive tobacco history with no plans to reduce or stop use. Start crestor 40 to replace previous mevacor

## 2023-08-15 NOTE — Assessment & Plan Note (Signed)
 Chronic, with elevated PSA Referral in for urology Repeat PSA pending

## 2023-08-15 NOTE — Assessment & Plan Note (Signed)
 Chronic, repeat A1c Was on janumet xr [phone] mg; however, no longer covered with insurance Recommend start of jardiance 10 with metformin 1000 mg BID

## 2023-08-15 NOTE — Assessment & Plan Note (Signed)
 Recommend Vit D testing given hx of mental illness and multiple co morbid conditions

## 2023-08-15 NOTE — Assessment & Plan Note (Signed)
 Chronic, stable Followed by mental health provider On 3 medications per med rec

## 2023-08-15 NOTE — Assessment & Plan Note (Signed)
 Chronic, stable Brief discussion regarding risks of tobacco/nicotine use and recommendations on ways to reduce use and work towards cessation of use of tobacco/nicotine products. Encouraged to use 1-800-QUIT-NOW.

## 2023-08-15 NOTE — Assessment & Plan Note (Signed)
 Chronic, stable Start low dose ACE to assist Toprol 50 mg

## 2023-08-15 NOTE — Assessment & Plan Note (Signed)
 Chronic LUTS, with elevated PSA Referral in for urology Repeat PSA pending

## 2023-08-15 NOTE — Assessment & Plan Note (Signed)
 Chronic, stable Discussed body mechanisms with lifting and avoiding straining Discussed importance of healthy weight management Discussed diet and exercise Consider referral to general surgery for additional eval if desired/worsening symptoms

## 2023-08-16 LAB — CBC WITH DIFFERENTIAL/PLATELET
Basophils Absolute: 0 10*3/uL (ref 0.0–0.2)
Basos: 1 %
EOS (ABSOLUTE): 0.1 10*3/uL (ref 0.0–0.4)
Eos: 1 %
Hematocrit: 48.5 % (ref 37.5–51.0)
Hemoglobin: 15.7 g/dL (ref 13.0–17.7)
Immature Grans (Abs): 0 10*3/uL (ref 0.0–0.1)
Immature Granulocytes: 1 %
Lymphocytes Absolute: 2.6 10*3/uL (ref 0.7–3.1)
Lymphs: 30 %
MCH: 30.7 pg (ref 26.6–33.0)
MCHC: 32.4 g/dL (ref 31.5–35.7)
MCV: 95 fL (ref 79–97)
Monocytes Absolute: 0.7 10*3/uL (ref 0.1–0.9)
Monocytes: 8 %
Neutrophils Absolute: 5.3 10*3/uL (ref 1.4–7.0)
Neutrophils: 59 %
Platelets: 296 10*3/uL (ref 150–450)
RBC: 5.12 x10E6/uL (ref 4.14–5.80)
RDW: 12.8 % (ref 11.6–15.4)
WBC: 8.8 10*3/uL (ref 3.4–10.8)

## 2023-08-16 LAB — COMPREHENSIVE METABOLIC PANEL
ALT: 14 [IU]/L (ref 0–44)
AST: 16 [IU]/L (ref 0–40)
Albumin: 4.2 g/dL (ref 3.9–4.9)
Alkaline Phosphatase: 98 [IU]/L (ref 44–121)
BUN/Creatinine Ratio: 10 (ref 10–24)
BUN: 12 mg/dL (ref 8–27)
Bilirubin Total: 0.4 mg/dL (ref 0.0–1.2)
CO2: 22 mmol/L (ref 20–29)
Calcium: 9.8 mg/dL (ref 8.6–10.2)
Chloride: 98 mmol/L (ref 96–106)
Creatinine, Ser: 1.26 mg/dL (ref 0.76–1.27)
Globulin, Total: 2.8 g/dL (ref 1.5–4.5)
Glucose: 150 mg/dL — ABNORMAL HIGH (ref 70–99)
Potassium: 4.7 mmol/L (ref 3.5–5.2)
Sodium: 137 mmol/L (ref 134–144)
Total Protein: 7 g/dL (ref 6.0–8.5)
eGFR: 63 mL/min/{1.73_m2} (ref >59)

## 2023-08-16 LAB — LIPID PANEL
Chol/HDL Ratio: 5.8 {ratio} — ABNORMAL HIGH (ref 0.0–5.0)
Cholesterol, Total: 192 mg/dL (ref 100–199)
HDL: 33 mg/dL — ABNORMAL LOW (ref >39)
LDL Chol Calc (NIH): 133 mg/dL — ABNORMAL HIGH (ref 0–99)
Triglycerides: 145 mg/dL (ref 0–149)
VLDL Cholesterol Cal: 26 mg/dL (ref 5–40)

## 2023-08-16 LAB — HEMOGLOBIN A1C
Est. average glucose Bld gHb Est-mCnc: 148 mg/dL
Hgb A1c MFr Bld: 6.8 % — ABNORMAL HIGH (ref 4.8–5.6)

## 2023-08-16 LAB — MICROALBUMIN / CREATININE URINE RATIO
Creatinine, Urine: 122.3 mg/dL
Microalb/Creat Ratio: 6 mg/g{creat} (ref 0–29)
Microalbumin, Urine: 7.4 ug/mL

## 2023-08-16 LAB — PSA: Prostate Specific Ag, Serum: 5.6 ng/mL — ABNORMAL HIGH (ref 0.0–4.0)

## 2023-08-16 LAB — VITAMIN D 25 HYDROXY (VIT D DEFICIENCY, FRACTURES): Vit D, 25-Hydroxy: 27.2 ng/mL — ABNORMAL LOW (ref 30.0–100.0)

## 2023-08-16 LAB — TSH: TSH: 3.06 u[IU]/mL (ref 0.450–4.500)

## 2023-08-18 ENCOUNTER — Other Ambulatory Visit: Payer: Self-pay | Admitting: Family Medicine

## 2023-08-18 DIAGNOSIS — E1165 Type 2 diabetes mellitus with hyperglycemia: Secondary | ICD-10-CM

## 2023-08-26 ENCOUNTER — Ambulatory Visit
Admission: RE | Admit: 2023-08-26 | Discharge: 2023-08-26 | Disposition: A | Discharge status: Home / Self Care | Payer: Self-pay | Source: Ambulatory Visit | Attending: Family Medicine | Admitting: Family Medicine

## 2023-08-26 DIAGNOSIS — E1169 Type 2 diabetes mellitus with other specified complication: Secondary | ICD-10-CM | POA: Insufficient documentation

## 2023-08-26 DIAGNOSIS — J432 Centrilobular emphysema: Secondary | ICD-10-CM | POA: Insufficient documentation

## 2023-08-26 DIAGNOSIS — I152 Hypertension secondary to endocrine disorders: Secondary | ICD-10-CM | POA: Insufficient documentation

## 2023-08-26 DIAGNOSIS — E1165 Type 2 diabetes mellitus with hyperglycemia: Secondary | ICD-10-CM | POA: Insufficient documentation

## 2023-08-26 DIAGNOSIS — I459 Conduction disorder, unspecified: Secondary | ICD-10-CM | POA: Insufficient documentation

## 2023-08-26 DIAGNOSIS — F17200 Nicotine dependence, unspecified, uncomplicated: Secondary | ICD-10-CM | POA: Insufficient documentation

## 2023-08-26 DIAGNOSIS — E785 Hyperlipidemia, unspecified: Secondary | ICD-10-CM | POA: Insufficient documentation

## 2023-08-26 DIAGNOSIS — E43 Unspecified severe protein-calorie malnutrition: Secondary | ICD-10-CM | POA: Insufficient documentation

## 2023-08-26 DIAGNOSIS — E1159 Type 2 diabetes mellitus with other circulatory complications: Secondary | ICD-10-CM | POA: Insufficient documentation

## 2023-08-28 ENCOUNTER — Telehealth: Payer: Self-pay | Admitting: Family Medicine

## 2023-08-28 NOTE — Telephone Encounter (Signed)
Mr. Luis Woods given imaging results and instructions. Verbalizes understanding.

## 2023-09-01 ENCOUNTER — Telehealth: Payer: Self-pay

## 2023-09-01 DIAGNOSIS — J432 Centrilobular emphysema: Secondary | ICD-10-CM

## 2023-09-01 NOTE — Telephone Encounter (Signed)
Darel Hong Crotts (pt's sister who is on [place]) called for lab results and discussion of something written on his AVS about malnutrition. Advised sister, that per OV notes, Wanted to check pt's Vitamin D level and cardiac CT results. Advised that Vitamin D low and gave food sources of that vitamin. Pt's sister would appreciate a call back to discuss lab results. No result notes seen.

## 2023-09-03 ENCOUNTER — Ambulatory Visit: Payer: Medicare Other | Admitting: Urology

## 2023-09-08 NOTE — Telephone Encounter (Signed)
Called and discussed lab results with patient's sister, Gabriel Cirri, who expressed understanding that the patient's calcium CT score was 0, though this was confusing/concerning to them.  However, the nodule noted on the CT was consistent with prior exams; informed her that there is no recommended further follow-up for this nodule.  She did request that I go ahead and rerefer him back to pulmonology, as the patient states he is willing to go now, where he had not been before.  Will go ahead and submit this. Also discussed that his cholesterol levels, despite having a good calcium cardiac score, were elevated and that this has been addressed at his most recent visit, with a switch from Melchor to rosuvastatin.  We will need to recheck it on his follow-up visit in April.  Answered all questions to Ms. Crotts' satisfaction.

## 2023-09-08 NOTE — Addendum Note (Signed)
Addended by: Jacquenette Shone on: 09/08/2023 01:14 PM   Modules accepted: Orders

## 2023-09-09 ENCOUNTER — Encounter: Payer: Medicare Other | Admitting: Family Medicine

## 2023-09-11 ENCOUNTER — Other Ambulatory Visit: Payer: Self-pay

## 2023-09-11 DIAGNOSIS — F17210 Nicotine dependence, cigarettes, uncomplicated: Secondary | ICD-10-CM

## 2023-09-11 DIAGNOSIS — Z122 Encounter for screening for malignant neoplasm of respiratory organs: Secondary | ICD-10-CM

## 2023-09-11 DIAGNOSIS — Z87891 Personal history of nicotine dependence: Secondary | ICD-10-CM

## 2023-10-05 DIAGNOSIS — I4510 Unspecified right bundle-branch block: Secondary | ICD-10-CM | POA: Insufficient documentation
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
## 2023-10-06 ENCOUNTER — Ambulatory Visit: Payer: Medicare Other | Attending: Cardiovascular Disease | Admitting: Cardiovascular Disease

## 2023-10-06 ENCOUNTER — Encounter: Payer: Self-pay | Admitting: Cardiovascular Disease

## 2023-10-06 VITALS — BP 90/60 | HR 84 | Ht 66.0 in | Wt 182.1 lb

## 2023-10-06 DIAGNOSIS — I4510 Unspecified right bundle-branch block: Secondary | ICD-10-CM | POA: Diagnosis present

## 2023-10-06 DIAGNOSIS — E1165 Type 2 diabetes mellitus with hyperglycemia: Secondary | ICD-10-CM | POA: Insufficient documentation

## 2023-10-06 DIAGNOSIS — E785 Hyperlipidemia, unspecified: Secondary | ICD-10-CM | POA: Insufficient documentation

## 2023-10-06 DIAGNOSIS — E1169 Type 2 diabetes mellitus with other specified complication: Secondary | ICD-10-CM | POA: Insufficient documentation

## 2023-10-06 DIAGNOSIS — I152 Hypertension secondary to endocrine disorders: Secondary | ICD-10-CM | POA: Insufficient documentation

## 2023-10-06 DIAGNOSIS — J432 Centrilobular emphysema: Secondary | ICD-10-CM | POA: Diagnosis not present

## 2023-10-06 DIAGNOSIS — E1159 Type 2 diabetes mellitus with other circulatory complications: Secondary | ICD-10-CM | POA: Diagnosis not present

## 2023-10-06 DIAGNOSIS — F17200 Nicotine dependence, unspecified, uncomplicated: Secondary | ICD-10-CM | POA: Diagnosis present

## 2023-10-06 NOTE — Patient Instructions (Signed)

## 2023-10-10 ENCOUNTER — Other Ambulatory Visit: Payer: Self-pay | Admitting: Physician Assistant

## 2023-10-13 ENCOUNTER — Encounter: Payer: Self-pay | Admitting: Internal Medicine

## 2023-10-13 ENCOUNTER — Ambulatory Visit: Payer: Medicare Other | Admitting: Internal Medicine

## 2023-10-13 VITALS — BP 110/70 | HR 88 | Temp 98.1°F | Ht 66.0 in | Wt 179.4 lb

## 2023-10-13 DIAGNOSIS — F17210 Nicotine dependence, cigarettes, uncomplicated: Secondary | ICD-10-CM

## 2023-10-13 DIAGNOSIS — R918 Other nonspecific abnormal finding of lung field: Secondary | ICD-10-CM

## 2023-10-13 DIAGNOSIS — J449 Chronic obstructive pulmonary disease, unspecified: Secondary | ICD-10-CM

## 2023-10-13 DIAGNOSIS — J439 Emphysema, unspecified: Secondary | ICD-10-CM | POA: Diagnosis not present

## 2023-10-13 NOTE — Patient Instructions (Addendum)
 Please Stop Smoking  Continue to use inhalers as needed-Spiriva and albuterol  Avoid Allergens and Irritants Avoid secondhand smoke Avoid SICK contacts Recommend  Masking  when appropriate Recommend Keep up-to-date with vaccinations  Referral to Lung Cancer screening program  Recommend obtaining pulmonary function test to assess lung function Recommend obtaining oxygen levels at nighttime
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
## 2023-11-06 ENCOUNTER — Telehealth: Payer: Self-pay | Admitting: Family Medicine

## 2023-11-06 DIAGNOSIS — E1159 Type 2 diabetes mellitus with other circulatory complications: Secondary | ICD-10-CM

## 2023-11-06 MED ORDER — METOPROLOL SUCCINATE ER 50 MG PO TB24: 50.0000 mg | ORAL_TABLET | Freq: Every day | ORAL | 1 refills | Status: DC

## 2023-11-06 NOTE — Telephone Encounter (Signed)
 [facility] is requesting refill metoprolol succinate (TOPROL-XL) 50 MG 24 hr tablet   Please advise

## 2023-11-10 ENCOUNTER — Other Ambulatory Visit: Payer: Self-pay | Admitting: Physician Assistant

## 2023-11-14 ENCOUNTER — Ambulatory Visit: Payer: Self-pay | Admitting: Family Medicine

## 2023-11-17 ENCOUNTER — Other Ambulatory Visit: Payer: Self-pay | Admitting: Physician Assistant

## 2023-11-28 ENCOUNTER — Telehealth: Payer: Self-pay

## 2023-11-28 NOTE — Telephone Encounter (Signed)
 Copied from CRM [id number]. Topic: General - Call Back - No Documentation >> Nov 28, 2023  9:10 AM Baldemar Lev wrote: Reason for CRM: Pt's sister is requesting a call back from the clinic to discuss medical supplies from [employer]. The pt's sister says she was told by someone at [facility] that they are supposed to pick up the device from the office while [employer] told them to retrieve it directly from their office.   Does not know the name of the device  Best contact: [phone] or [phone]

## 2023-12-02 ENCOUNTER — Other Ambulatory Visit: Payer: Self-pay | Admitting: Family Medicine

## 2023-12-02 DIAGNOSIS — E1165 Type 2 diabetes mellitus with hyperglycemia: Secondary | ICD-10-CM

## 2023-12-02 NOTE — Telephone Encounter (Signed)
 [facility] faxed refill request for the following medications:   JARDIANCE  10 MG TABS tablet    Please advise.

## 2023-12-02 NOTE — Telephone Encounter (Signed)
 I spoke with this patient's family, Kendra Pavy. And he has had the test done and it was through the pulomonologists office.

## 2023-12-03 MED ORDER — EMPAGLIFLOZIN 10 MG PO TABS: 10.0000 mg | ORAL_TABLET | Freq: Every day | ORAL | 0 refills | Status: DC

## 2023-12-05 ENCOUNTER — Telehealth: Payer: Self-pay

## 2023-12-05 DIAGNOSIS — J449 Chronic obstructive pulmonary disease, unspecified: Secondary | ICD-10-CM

## 2023-12-05 NOTE — Telephone Encounter (Signed)
 ONO reviewed by Dr. Auston Left- Low SpO2 77%. 1L [place] oxygen needed.  Lm x1 for the patient.

## 2023-12-16 ENCOUNTER — Encounter: Payer: Self-pay | Admitting: Family Medicine

## 2023-12-16 ENCOUNTER — Ambulatory Visit (INDEPENDENT_AMBULATORY_CARE_PROVIDER_SITE_OTHER): Admitting: Family Medicine

## 2023-12-16 VITALS — BP 106/78 | HR 84 | Resp 16 | Ht 66.0 in | Wt 175.7 lb

## 2023-12-16 DIAGNOSIS — E1169 Type 2 diabetes mellitus with other specified complication: Secondary | ICD-10-CM | POA: Diagnosis not present

## 2023-12-16 DIAGNOSIS — E1159 Type 2 diabetes mellitus with other circulatory complications: Secondary | ICD-10-CM | POA: Diagnosis not present

## 2023-12-16 DIAGNOSIS — Z1211 Encounter for screening for malignant neoplasm of colon: Secondary | ICD-10-CM

## 2023-12-16 DIAGNOSIS — E785 Hyperlipidemia, unspecified: Secondary | ICD-10-CM

## 2023-12-16 DIAGNOSIS — E1165 Type 2 diabetes mellitus with hyperglycemia: Secondary | ICD-10-CM | POA: Diagnosis not present

## 2023-12-16 DIAGNOSIS — J432 Centrilobular emphysema: Secondary | ICD-10-CM

## 2023-12-16 DIAGNOSIS — F17200 Nicotine dependence, unspecified, uncomplicated: Secondary | ICD-10-CM

## 2023-12-16 DIAGNOSIS — I700 Atherosclerosis of aorta: Secondary | ICD-10-CM

## 2023-12-16 DIAGNOSIS — I152 Hypertension secondary to endocrine disorders: Secondary | ICD-10-CM

## 2023-12-16 DIAGNOSIS — Z1212 Encounter for screening for malignant neoplasm of rectum: Secondary | ICD-10-CM

## 2023-12-16 DIAGNOSIS — Z23 Encounter for immunization: Secondary | ICD-10-CM

## 2023-12-16 DIAGNOSIS — J439 Emphysema, unspecified: Secondary | ICD-10-CM

## 2023-12-16 MED ORDER — ROSUVASTATIN CALCIUM 40 MG PO TABS: 40.0000 mg | ORAL_TABLET | Freq: Every day | ORAL | 4 refills | Status: AC

## 2023-12-16 MED ORDER — LISINOPRIL 2.5 MG PO TABS: 2.5000 mg | ORAL_TABLET | Freq: Every day | ORAL | 4 refills | Status: AC

## 2023-12-16 MED ORDER — ALBUTEROL SULFATE HFA 108 (90 BASE) MCG/ACT IN AERS: 2.0000 | INHALATION_SPRAY | Freq: Four times a day (QID) | RESPIRATORY_TRACT | 11 refills | Status: AC | PRN

## 2023-12-16 MED ORDER — METOPROLOL SUCCINATE ER 50 MG PO TB24
50.0000 mg | ORAL_TABLET | Freq: Every day | ORAL | 0 refills | Status: DC
Start: 2023-12-16 — End: 2024-06-29

## 2023-12-16 MED ORDER — EMPAGLIFLOZIN 10 MG PO TABS: 10.0000 mg | ORAL_TABLET | Freq: Every day | ORAL | 0 refills | Status: DC

## 2023-12-16 MED ORDER — JANUMET XR 100-1000 MG PO TB24: 1.0000 | ORAL_TABLET | Freq: Every day | ORAL | 1 refills | Status: DC

## 2023-12-16 MED ORDER — TIOTROPIUM BROMIDE MONOHYDRATE 18 MCG IN CAPS: 18.0000 ug | ORAL_CAPSULE | RESPIRATORY_TRACT | 3 refills | Status: DC

## 2023-12-16 MED ORDER — SHINGRIX 50 MCG/0.5ML IM SUSR: 0.5000 mL | Freq: Once | INTRAMUSCULAR | 0 refills | Status: AC

## 2023-12-16 NOTE — Progress Notes (Signed)
 Established patient visit   Patient: Luis Woods   DOB: 1955-12-15   68 y.o. Male  MRN: [id number] Visit Date: 12/16/2023  Today's healthcare provider: Carlean Charter, DO   Chief Complaint  Patient presents with   Medical Management of Chronic Issues   Subjective    HPI Last mAWV 07/18/2023  Luis Woods is a 68 year old male with COPD who presents for a regular follow-up visit.  He has a history of COPD and continues to smoke. He uses inhalers like albuterol  and Spiriva  occasionally but not regularly. He underwent an overnight oxygen study and was advised to use one liter of oxygen per night.  He is currently taking Prozac 10 mg, which he finds effective for managing emotional problems and depression following the loss of his dog and mother last year. He reports no adverse reactions to Prozac and feels it has significantly improved his quality of life.  He notes a gradual weight loss from 200 pounds to 175 pounds over the past year and a half, despite no significant changes in diet. He has a history of type 2 diabetes and is mindful of sugar intake, avoiding it in coffee at home but occasionally using sugar when out.  He has not had recent eye exams and reports seeing halos around lights and difficulty focusing on certain patterns. No numbness or tingling in his feet currently, though he experienced it in the past. He occasionally feels pressure in his calves when getting up from the couch.  He reports bruising easily and has noticed white spots on his skin where bruises have healed. No history of wounds that do not heal. He occasionally experiences tightness in his calves but no significant pain.   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 No specific nodule follow-up is needed.      Medications: Outpatient Medications Prior to Visit  Medication Sig    FLUoxetine (PROZAC) 20 MG capsule Take 20 mg by mouth daily.   imipramine (TOFRANIL) 50 MG tablet Take 100 mg by mouth at bedtime.   perphenazine (TRILAFON) 2 MG tablet Take 2 mg by mouth 2 (two) times daily.   [DISCONTINUED] albuterol  (VENTOLIN  HFA) 108 (90 Base) MCG/ACT inhaler Inhale 2 puffs into the lungs every 6 (six) hours as needed for wheezing or shortness of breath.   [DISCONTINUED] empagliflozin  (JARDIANCE ) 10 MG TABS tablet Take 1 tablet (10 mg total) by mouth daily.   [DISCONTINUED] JANUMET  XR [phone] MG TB24 TAKE ONE TABLET BY MOUTH ONCE A DAY   [DISCONTINUED] lisinopril  (ZESTRIL ) 2.5 MG tablet Take 1 tablet (2.5 mg total) by mouth daily.   [DISCONTINUED] metoprolol  succinate (TOPROL -XL) 50 MG 24 hr tablet Take 1 tablet (50 mg total) by mouth daily.   [DISCONTINUED] rosuvastatin  (CRESTOR ) 40 MG tablet Take 1 tablet (40 mg total) by mouth daily.   [DISCONTINUED] tiotropium (SPIRIVA ) 18 MCG inhalation capsule Place 1 capsule (18 mcg total) into inhaler and inhale 1 day or 1 dose for 1 dose.   No facility-administered medications prior to visit.        Objective    BP 106/78 (BP Location: Left Arm, Patient Position: Sitting, Cuff Size: Normal)   Pulse 84   Resp 16   Ht 5\' 6"  (1.676 m)   Wt 175 lb 11.2 oz (79.7 kg)   SpO2 98%   BMI 28.36 kg/m  Physical Exam Constitutional:      Appearance: Normal appearance.  HENT:     Head: Normocephalic and atraumatic.  Eyes:     General: No scleral icterus.    Conjunctiva/sclera: Conjunctivae normal.  Cardiovascular:     Pulses:          Dorsalis pedis pulses are 2+ on the right side and 2+ on the left side.       Posterior tibial pulses are 2+ on the right side and 2+ on the left side.  Musculoskeletal:     Right foot: Normal range of motion. No deformity, bunion, Charcot foot, foot drop or prominent metatarsal heads.     Left foot: Normal range of motion. No deformity, bunion, Charcot foot, foot drop or prominent  metatarsal heads.  Feet:     Right foot:     Protective Sensation: 10 sites tested.  10 sites sensed.     Skin integrity: No ulcer, blister, skin breakdown, erythema, warmth, callus, dry skin or fissure.     Toenail Condition: Right toenails are normal.     Left foot:     Protective Sensation: 10 sites tested.  10 sites sensed.     Skin integrity: No ulcer, blister, skin breakdown, erythema, warmth, callus, dry skin or fissure.     Toenail Condition: Left toenails are normal.  Neurological:     Mental Status: He is alert and oriented to person, place, and time. Mental status is at baseline.  Psychiatric:        Mood and Affect: Mood normal.        Behavior: Behavior normal.      No results found for any visits on 12/16/23.  Assessment & Plan    Type 2 diabetes mellitus with hyperglycemia, without long-term current use of insulin (HCC) -     Hemoglobin A1c -     Janumet  XR; Take 1 tablet by mouth daily.  Dispense: 90 tablet; Refill: 1 -     Empagliflozin ; Take 1 tablet (10 mg total) by mouth daily.  Dispense: 90 tablet; Refill: 0  Centrilobular emphysema (HCC)  Hypertension associated with diabetes (HCC) -     Metoprolol  Succinate ER; Take 1 tablet (50 mg total) by mouth daily.  Dispense: 90 tablet; Refill: 0 -     Lisinopril ; Take 1 tablet (2.5 mg total) by mouth daily.  Dispense: 90 tablet; Refill: 4  Hyperlipidemia associated with type 2 diabetes mellitus (HCC) -     Lipid Panel With LDL/HDL Ratio -     Rosuvastatin  Calcium ; Take 1 tablet (40 mg total) by mouth daily.  Dispense: 90 tablet; Refill: 4  Tobacco dependence -     Rosuvastatin  Calcium ; Take 1 tablet (40 mg total) by mouth daily.  Dispense: 90 tablet; Refill: 4  Encounter for colorectal cancer screening -     Cologuard  Aortic atherosclerosis (HCC)  Pulmonary emphysema, unspecified emphysema type (HCC) -     Tiotropium Bromide  Monohydrate; Place 1 capsule (18 mcg total) into inhaler and inhale 1 day or 1 dose.   Dispense: 90 capsule; Refill: 3 -     Albuterol  Sulfate HFA; Inhale 2 puffs into the lungs every 6 (six) hours as needed for wheezing or shortness of breath.  Dispense: 18 g; Refill: 11  Need for shingles vaccine -     Shingrix ; Inject 0.5 mLs into the muscle once for 1 dose.  Dispense: 0.5 mL; Refill: 0      Type 2 diabetes Routine follow-up for  chronic condition management.  Ordered A1c today.  Patient currently managed with Janumet  XR and empagliflozin .  Will refill these today. - Perform foot exam.  Chronic Obstructive Pulmonary Disease (COPD)/centrilobular emphysema COPD is progressive; smoking cessation and oxygen therapy are critical. Current management includes inhalers and nocturnal oxygen. - Encourage smoking cessation. - Continue supplemental oxygen at night. - Refill albuterol  inhaler and Spiriva .  Nicotine dependence Continued smoking impacts COPD progression. Smoking cessation is crucial.  Patient is not interested in smoking cessation at this time. - Encourage smoking cessation. - Patient's low-dose CT to screen for lung cancer has been ordered by pulmonary.  However, given his unremarkable CT to assess his cardiac score, he is up-to-date and will need recheck in 1 year.  Aortic atherosclerosis Noted.  Continue risk factor management  Depression Depression well-managed with Prozac 10 mg. Significant improvement noted without adverse effects.  Weight loss Gradual weight loss from 200 lbs to 175 lbs over 1.5 years. No significant dietary changes.  As the last was gradual, discussed with patient that this is more likely related to small changes in habits and dietary intake.  Will continue to monitor.  Allergy to sulfa drugs and Klonopin Allergic reactions include skin rashes and itching. Avoidance necessary.  General Health Maintenance Discussed immunizations and screenings. Shingles vaccine and colon cancer screening recommended. Lung cancer screening deferred. Eye  exam overdue. - Recommend shingles vaccine at pharmacy. - Order colon cancer screening kit. - Postpone lung cancer screening CT scan to next year. - Recommend annual eye exam.    Return in about 3 months (around 03/17/2024) for Chronic f/u.      I discussed the assessment and treatment plan with the patient  The patient was provided an opportunity to ask questions and all were answered. The patient agreed with the plan and demonstrated an understanding of the instructions.   The patient was advised to call back or seek an in-person evaluation if the symptoms worsen or if the condition fails to improve as anticipated.    Carlean Charter, DO  [facility] [phone] (phone) [phone] (fax)  [facility]

## 2023-12-16 NOTE — Patient Instructions (Addendum)
 Call to schedule your lung cancer screening   [facility]    [address]    [place],  [place]  [address]    Main: [phone]  Get your shingles vaccine at the pharmacy

## 2023-12-17 ENCOUNTER — Other Ambulatory Visit (HOSPITAL_COMMUNITY): Payer: Self-pay

## 2023-12-17 ENCOUNTER — Telehealth: Payer: Self-pay

## 2023-12-17 DIAGNOSIS — J432 Centrilobular emphysema: Secondary | ICD-10-CM

## 2023-12-17 NOTE — Telephone Encounter (Signed)
 Pharmacy Patient Advocate Encounter   Received notification from Onbase that prior authorization for Spiriva  HandiHaler 18MCG capsules is required/requested.   Insurance verification completed.   The patient is insured through [facility].   Per test claim: PA required; PA submitted to above mentioned insurance via CoverMyMeds Key/confirmation #/EOC [id number] Status is pending

## 2023-12-19 NOTE — Telephone Encounter (Signed)
 Pharmacy Patient Advocate Encounter  Received notification from [facility] MEDICARE that Prior Authorization for Spiriva  HandiHaler capsules has been DENIED.  Full denial letter will be uploaded to the media tab. See denial reason below.   PA #/Case ID/Reference #: [id number]

## 2023-12-24 MED ORDER — UMECLIDINIUM BROMIDE 62.5 MCG/ACT IN AEPB: 1.0000 | INHALATION_SPRAY | Freq: Every day | RESPIRATORY_TRACT | 11 refills | Status: AC

## 2023-12-24 NOTE — Addendum Note (Signed)
 Addended by: Judyann Number on: 12/24/2023 07:42 AM   Modules accepted: Orders

## 2024-01-16 LAB — COLOGUARD: COLOGUARD: POSITIVE — AB

## 2024-01-19 ENCOUNTER — Ambulatory Visit: Payer: Self-pay | Admitting: Family Medicine

## 2024-01-19 DIAGNOSIS — R195 Other fecal abnormalities: Secondary | ICD-10-CM

## 2024-01-21 ENCOUNTER — Other Ambulatory Visit: Payer: Self-pay | Admitting: Family Medicine

## 2024-01-21 DIAGNOSIS — E119 Type 2 diabetes mellitus without complications: Secondary | ICD-10-CM

## 2024-02-04 ENCOUNTER — Encounter: Payer: Self-pay | Admitting: *Deleted

## 2024-02-04 ENCOUNTER — Telehealth: Payer: Self-pay | Admitting: Family Medicine

## 2024-02-04 NOTE — Telephone Encounter (Signed)
 [facility] is asking for refills on Metformin  HCL ER 500 mg. #180

## 2024-02-04 NOTE — Telephone Encounter (Signed)
 Pharmacy advised rx not current due to change of therapy Reports they are not sure why request was sent and will d/c rx in their system

## 2024-02-09 ENCOUNTER — Other Ambulatory Visit: Payer: Self-pay | Admitting: Family Medicine

## 2024-02-09 DIAGNOSIS — E1165 Type 2 diabetes mellitus with hyperglycemia: Secondary | ICD-10-CM

## 2024-02-24 LAB — OPHTHALMOLOGY REPORT-SCANNED

## 2024-03-17 ENCOUNTER — Ambulatory Visit: Admitting: Family Medicine

## 2024-04-29 ENCOUNTER — Other Ambulatory Visit: Payer: Self-pay | Admitting: Family Medicine

## 2024-04-29 DIAGNOSIS — E1165 Type 2 diabetes mellitus with hyperglycemia: Secondary | ICD-10-CM

## 2024-05-11 ENCOUNTER — Encounter: Payer: Self-pay | Admitting: Internal Medicine

## 2024-05-28 ENCOUNTER — Other Ambulatory Visit: Payer: Self-pay | Admitting: Family Medicine

## 2024-05-28 DIAGNOSIS — E1165 Type 2 diabetes mellitus with hyperglycemia: Secondary | ICD-10-CM

## 2024-06-10 ENCOUNTER — Other Ambulatory Visit: Payer: Self-pay | Admitting: Family Medicine

## 2024-06-10 DIAGNOSIS — E1165 Type 2 diabetes mellitus with hyperglycemia: Secondary | ICD-10-CM

## 2024-06-16 ENCOUNTER — Ambulatory Visit: Admitting: Family Medicine

## 2024-06-29 ENCOUNTER — Encounter: Payer: Self-pay | Admitting: Family Medicine

## 2024-06-29 ENCOUNTER — Ambulatory Visit (INDEPENDENT_AMBULATORY_CARE_PROVIDER_SITE_OTHER): Admitting: Family Medicine

## 2024-06-29 VITALS — BP 92/70 | HR 73 | Temp 97.8°F | Ht 66.0 in | Wt 181.7 lb

## 2024-06-29 DIAGNOSIS — E785 Hyperlipidemia, unspecified: Secondary | ICD-10-CM

## 2024-06-29 DIAGNOSIS — N182 Chronic kidney disease, stage 2 (mild): Secondary | ICD-10-CM | POA: Diagnosis not present

## 2024-06-29 DIAGNOSIS — E1122 Type 2 diabetes mellitus with diabetic chronic kidney disease: Secondary | ICD-10-CM | POA: Diagnosis not present

## 2024-06-29 DIAGNOSIS — E1169 Type 2 diabetes mellitus with other specified complication: Secondary | ICD-10-CM

## 2024-06-29 DIAGNOSIS — F17200 Nicotine dependence, unspecified, uncomplicated: Secondary | ICD-10-CM

## 2024-06-29 DIAGNOSIS — Z79899 Other long term (current) drug therapy: Secondary | ICD-10-CM

## 2024-06-29 DIAGNOSIS — Z23 Encounter for immunization: Secondary | ICD-10-CM

## 2024-06-29 DIAGNOSIS — I152 Hypertension secondary to endocrine disorders: Secondary | ICD-10-CM

## 2024-06-29 DIAGNOSIS — E1159 Type 2 diabetes mellitus with other circulatory complications: Secondary | ICD-10-CM

## 2024-06-29 DIAGNOSIS — F209 Schizophrenia, unspecified: Secondary | ICD-10-CM

## 2024-06-29 DIAGNOSIS — Z7984 Long term (current) use of oral hypoglycemic drugs: Secondary | ICD-10-CM

## 2024-06-29 LAB — POCT GLYCOSYLATED HEMOGLOBIN (HGB A1C): Hemoglobin A1C: 7.1 % — AB (ref 4.0–5.6)

## 2024-06-29 MED ORDER — EMPAGLIFLOZIN 25 MG PO TABS: 25.0000 mg | ORAL_TABLET | Freq: Every day | ORAL | 3 refills | Status: AC

## 2024-06-29 MED ORDER — METOPROLOL SUCCINATE ER 25 MG PO TB24: 25.0000 mg | ORAL_TABLET | Freq: Every day | ORAL | 3 refills | Status: AC

## 2024-06-29 NOTE — Progress Notes (Addendum)
 Established patient visit   Patient: Luis Woods   DOB: November 02, 1955   68 y.o. Male  MRN: [id number] Visit Date: 06/29/2024  Today's healthcare provider: LAURAINE LOISE BUOY, DO   Chief Complaint  Patient presents with   Medical Management of Chronic Issues    Patient was supposed to have follow up in 3 months in August, now he is here for his follow up.  Reports that he does not monitor glucose at home.    Diabetic Eye Exam- [facility] sent request  Flu Vaccine- yes    Subjective    HPI Luis Woods is a 68 year old male with hypertension and diabetes who presents for a flu shot and evaluation of dizziness.  He experiences dizziness occasionally, particularly in the late afternoon, lasting for about a minute or less. It occurs intermittently and he associates it with his high blood pressure. He is not currently taking any specific medication for dizziness, although he was previously prescribed meclizine.  He has a history of hypertension and is currently taking metoprolol  succinate 50 mg daily and lisinopril  2.5 mg daily.  He has diabetes but does not check his blood sugars at home.  He has adjusted his coffee consumption to avoid sugar.  He smokes two packs of cigarettes a day and has considered quitting but has not made any attempts yet.  He is currently on Prozac, which he started in 10-23-23 of the previous year following the death of his dog, Ezella, in 2023-10-23 and mother in April. Prozac has helped him manage his emotions and he has not experienced crying or significant emotional distress since starting the medication.  No shortness of breath when bending over, racing heartbeat, or significant side effects from his medications. He reports a persistent cough but denies it being constant throughout the day.      Medications: Outpatient Medications Prior to Visit  Medication Sig Note   albuterol  (VENTOLIN  HFA) 108 (90 Base) MCG/ACT inhaler Inhale 2 puffs into the  lungs every 6 (six) hours as needed for wheezing or shortness of breath.    FLUoxetine (PROZAC) 20 MG capsule Take 20 mg by mouth daily.    imipramine (TOFRANIL) 50 MG tablet Take 100 mg by mouth at bedtime.    lisinopril  (ZESTRIL ) 2.5 MG tablet Take 1 tablet (2.5 mg total) by mouth daily.    perphenazine (TRILAFON) 2 MG tablet Take 2 mg by mouth 2 (two) times daily.    rosuvastatin  (CRESTOR ) 40 MG tablet Take 1 tablet (40 mg total) by mouth daily.    SitaGLIPtin-MetFORMIN  HCl (JANUMET  XR) [phone] MG TB24 TAKE ONE TABLET BY MOUTH ONCE A DAY    umeclidinium bromide  (INCRUSE ELLIPTA ) 62.5 MCG/ACT AEPB Inhale 1 puff into the lungs daily.    [DISCONTINUED] empagliflozin  (JARDIANCE ) 10 MG TABS tablet Take 1 tablet (10 mg total) by mouth daily.    [DISCONTINUED] metoprolol  succinate (TOPROL -XL) 50 MG 24 hr tablet Take 1 tablet (50 mg total) by mouth daily. 06/29/2024: dizziness   No facility-administered medications prior to visit.    Review of Systems  Respiratory: Negative.  Negative for cough, shortness of breath and wheezing.   Cardiovascular:  Negative for chest pain, palpitations and leg swelling.  Neurological:  Negative for weakness and headaches.        Objective    BP 92/70 (BP Location: Right Arm, Patient Position: Sitting, Cuff Size: Normal)   Pulse 73   Temp 97.8 F (36.6 C) (Oral)  Ht 5' 6 (1.676 m)   Wt 181 lb 11.2 oz (82.4 kg)   SpO2 98%   BMI 29.33 kg/m     Physical Exam Vitals reviewed.  Constitutional:      General: He is not in acute distress.    Appearance: Normal appearance. He is not diaphoretic.  HENT:     Head: Normocephalic and atraumatic.  Eyes:     General: No scleral icterus.    Conjunctiva/sclera: Conjunctivae normal.  Cardiovascular:     Rate and Rhythm: Normal rate and regular rhythm.     Pulses: Normal pulses.     Heart sounds: Normal heart sounds. No murmur heard. Pulmonary:     Effort: Pulmonary effort is normal. No respiratory  distress.     Breath sounds: Normal breath sounds. No wheezing or rhonchi.  Musculoskeletal:     Cervical back: Neck supple.     Right lower leg: No edema.     Left lower leg: No edema.  Lymphadenopathy:     Cervical: No cervical adenopathy.  Skin:    General: Skin is warm and dry.     Findings: No rash.  Neurological:     Mental Status: He is alert and oriented to person, place, and time. Mental status is at baseline.  Psychiatric:        Mood and Affect: Mood normal.        Behavior: Behavior normal.      No results found for any visits on 06/29/24.  Assessment & Plan    Type 2 diabetes mellitus with stage 2 chronic kidney disease, without long-term current use of insulin (HCC) -     POCT glycosylated hemoglobin (Hb A1C) -     Microalbumin / creatinine urine ratio -     Comprehensive metabolic panel with GFR -     Hemoglobin A1c -     Vitamin B12 -     Empagliflozin ; Take 1 tablet (25 mg total) by mouth daily.  Dispense: 90 tablet; Refill: 3  Hyperlipidemia associated with type 2 diabetes mellitus (HCC) -     Lipid panel  High risk medication use -     Vitamin B12  Hypertension associated with diabetes (HCC) Assessment & Plan: Chronic, mildly hypotensive today with report of intermittent dizziness.  - Will reduce metoprolol  succinate from 50 mg to 25 mg today.  - Continue lisinopril  2.5 mg daily.  Orders: -     Metoprolol  Succinate ER; Take 1 tablet (25 mg total) by mouth daily.  Dispense: 90 tablet; Refill: 3  Schizophrenia, unspecified type (HCC) Assessment & Plan: Chronic, stable. Follows with behavioral health in [place]. On perphenazine 2 mg twice daily, imipramine 100 mg daily at bedtime, and fluoxetine 20 mg daily. No acute concerns. Defer to specialist management.   Nicotine dependence with current use  Need for influenza vaccination -     Flu vaccine HIGH DOSE PF(Fluzone Trivalent)      Type 2 diabetes mellitus with stage chronic kidney  disease, without current long-term use of insulin A1c at 7.1 today indicates suboptimal control. Smoking may worsen glycemic control. - Recommended shingles vaccine at pharmacy. - Discussed COVID booster availability at pharmacy.  Hyperlipidemia associated with type 2 diabetes Chronic, stable.  Managed with rosuvastatin  40 mg daily.  No changes today.  Nicotine Dependence current use Smoking two packs daily. Discussed impact on diabetes and health. - Discussed smoking cessation options.  Patient declines at this time.    Return in about 4 weeks (  around 07/27/2024) for mAWV, in 3 months for Chronic f/u, and in 6 months for [facility] w/new provider.      I discussed the assessment and treatment plan with the patient  The patient was provided an opportunity to ask questions and all were answered. The patient agreed with the plan and demonstrated an understanding of the instructions.   The patient was advised to call back or seek an in-person evaluation if the symptoms worsen or if the condition fails to improve as anticipated.    LAURAINE LOISE BUOY, DO  [facility] [phone] (phone) [phone] (fax)  [facility]

## 2024-06-29 NOTE — Patient Instructions (Addendum)
 Recommended vaccines to get at the pharmacy: second dose of Shingrix  (shingles) and covid booster.

## 2024-06-29 NOTE — Assessment & Plan Note (Signed)
 Chronic, mildly hypotensive today with report of intermittent dizziness.  - Will reduce metoprolol  succinate from 50 mg to 25 mg today.  - Continue lisinopril  2.5 mg daily.

## 2024-06-29 NOTE — Assessment & Plan Note (Signed)
 Chronic, stable. Follows with behavioral health in [place]. On perphenazine 2 mg twice daily, imipramine 100 mg daily at bedtime, and fluoxetine 20 mg daily. No acute concerns. Defer to specialist management.

## 2024-06-30 LAB — HEMOGLOBIN A1C
Est. average glucose Bld gHb Est-mCnc: 166 mg/dL
Hgb A1c MFr Bld: 7.4 % — ABNORMAL HIGH (ref 4.8–5.6)

## 2024-06-30 LAB — COMPREHENSIVE METABOLIC PANEL WITH GFR
ALT: 15 IU/L (ref 0–44)
AST: 15 IU/L (ref 0–40)
Albumin: 4.1 g/dL (ref 3.9–4.9)
Alkaline Phosphatase: 97 IU/L (ref 47–123)
BUN/Creatinine Ratio: 17 (ref 10–24)
BUN: 20 mg/dL (ref 8–27)
Bilirubin Total: <0.2 mg/dL (ref 0.0–1.2)
CO2: 25 mmol/L (ref 20–29)
Calcium: 10 mg/dL (ref 8.6–10.2)
Chloride: 103 mmol/L (ref 96–106)
Creatinine, Ser: 1.17 mg/dL (ref 0.76–1.27)
Globulin, Total: 2.7 g/dL (ref 1.5–4.5)
Glucose: 137 mg/dL — ABNORMAL HIGH (ref 70–99)
Potassium: 5 mmol/L (ref 3.5–5.2)
Sodium: 141 mmol/L (ref 134–144)
Total Protein: 6.8 g/dL (ref 6.0–8.5)
eGFR: 69 mL/min/1.73 (ref >59)

## 2024-06-30 LAB — LIPID PANEL
Chol/HDL Ratio: 3.3 ratio (ref 0.0–5.0)
Cholesterol, Total: 112 mg/dL (ref 100–199)
HDL: 34 mg/dL — ABNORMAL LOW (ref >39)
LDL Chol Calc (NIH): 53 mg/dL (ref 0–99)
Triglycerides: 145 mg/dL (ref 0–149)
VLDL Cholesterol Cal: 25 mg/dL (ref 5–40)

## 2024-06-30 LAB — VITAMIN B12: Vitamin B-12: 643 pg/mL (ref 232–1245)

## 2024-06-30 LAB — MICROALBUMIN / CREATININE URINE RATIO
Creatinine, Urine: 48.6 mg/dL
Microalb/Creat Ratio: 7 mg/g{creat} (ref 0–29)
Microalbumin, Urine: 3.2 ug/mL

## 2024-07-05 ENCOUNTER — Encounter: Payer: Self-pay | Admitting: Acute Care

## 2024-07-12 ENCOUNTER — Ambulatory Visit: Payer: Self-pay | Admitting: Family Medicine

## 2024-07-29 ENCOUNTER — Other Ambulatory Visit: Payer: Self-pay | Admitting: Family Medicine

## 2024-07-29 DIAGNOSIS — E1159 Type 2 diabetes mellitus with other circulatory complications: Secondary | ICD-10-CM

## 2024-08-31 ENCOUNTER — Ambulatory Visit

## 2024-09-03 ENCOUNTER — Other Ambulatory Visit: Payer: Self-pay | Admitting: Family Medicine

## 2024-09-03 DIAGNOSIS — E1165 Type 2 diabetes mellitus with hyperglycemia: Secondary | ICD-10-CM

## 2024-09-03 NOTE — Telephone Encounter (Signed)
 Requested medications are due for refill today.  yes   Requested medications are on the active medications list.  yes   Last refill. 06/10/2024 #90 0 rf   Future visit scheduled.   yes   Notes to clinic.  Expired labs.      Requested Prescriptions  Pending Prescriptions Disp Refills   JANUMET  XR [phone] MG TB24 [Pharmacy Med Name: JANUMET  XR [phone] TABLET ER 24HR] 90 tablet 0    Sig: TAKE ONE TABLET BY MOUTH ONCE A DAY     Endocrinology:  Diabetes - Biguanide + DPP-4 Inhibitor Combos Failed - 09/03/2024  3:40 PM      Failed - CBC within normal limits and completed in the last 12 months    WBC  Date Value Ref Range Status  08/15/2023 8.8 3.4 - 10.8 x10E3/uL Final   RBC  Date Value Ref Range Status  08/15/2023 5.12 4.14 - 5.80 x10E6/uL Final   Hemoglobin  Date Value Ref Range Status  08/15/2023 15.7 13.0 - 17.7 g/dL Final   Hematocrit  Date Value Ref Range Status  08/15/2023 48.5 37.5 - 51.0 % Final   MCHC  Date Value Ref Range Status  08/15/2023 32.4 31.5 - 35.7 g/dL Final   [facility]  Date Value Ref Range Status  08/15/2023 30.7 26.6 - 33.0 pg Final   MCV  Date Value Ref Range Status  08/15/2023 95 79 - 97 fL Final   No results found for: PLTCOUNTKUC, LABPLAT, POCPLA RDW  Date Value Ref Range Status  08/15/2023 12.8 11.6 - 15.4 % Final         Passed - HBA1C is between 0 and 7.9 and within 180 days    Hemoglobin A1C  Date Value Ref Range Status  06/29/2024 7.1 (A) 4.0 - 5.6 % Final   Hgb A1c MFr Bld  Date Value Ref Range Status  06/29/2024 7.4 (H) 4.8 - 5.6 % Final    Comment:             Prediabetes: 5.7 - 6.4          Diabetes: >6.4          Glycemic control for adults with diabetes: <7.0          Passed - Cr in normal range and within 360 days    Creatinine, Ser  Date Value Ref Range Status  06/29/2024 1.17 0.76 - 1.27 mg/dL Final         Passed - eGFR in normal range and within 360 days    GFR calc Af Amer  Date Value Ref Range  Status  05/18/2020 82 >59 mL/min/1.73 Final    Comment:    **[facility] currently reports eGFR in compliance with the current**   recommendations of the [employer]. [facility] will   update reporting as new guidelines are published from the NKF-ASN   Task force.    GFR calc non Af Amer  Date Value Ref Range Status  05/18/2020 71 >59 mL/min/1.73 Final   eGFR  Date Value Ref Range Status  06/29/2024 69 >59 mL/min/1.73 Final         Passed - B12 Level in normal range and within 720 days    Vitamin B-12  Date Value Ref Range Status  06/29/2024 643 232 - 1,245 pg/mL Final         Passed - Valid encounter within last 6 months    Recent Outpatient Visits  2 months ago Type 2 diabetes mellitus with stage 2 chronic kidney disease, without long-term current use of insulin [facility])   [facility] [place], Lauraine SAILOR, DO   8 months ago Type 2 diabetes mellitus with hyperglycemia, without long-term current use of insulin [facility])   [facility] [place], Lauraine SAILOR, DO

## 2024-09-03 NOTE — Telephone Encounter (Signed)
 Requested medications are due for refill today.  yes  Requested medications are on the active medications list.  yes  Last refill. 06/10/2024 #90 0 rf  Future visit scheduled.   yes  Notes to clinic.  Expired labs.    Requested Prescriptions  Pending Prescriptions Disp Refills   JANUMET  XR [phone] MG TB24 [Pharmacy Med Name: JANUMET  XR [phone] TABLET ER 24HR] 90 tablet 0    Sig: TAKE ONE TABLET BY MOUTH ONCE A DAY     Endocrinology:  Diabetes - Biguanide + DPP-4 Inhibitor Combos Failed - 09/03/2024  3:33 PM      Failed - CBC within normal limits and completed in the last 12 months    WBC  Date Value Ref Range Status  08/15/2023 8.8 3.4 - 10.8 x10E3/uL Final   RBC  Date Value Ref Range Status  08/15/2023 5.12 4.14 - 5.80 x10E6/uL Final   Hemoglobin  Date Value Ref Range Status  08/15/2023 15.7 13.0 - 17.7 g/dL Final   Hematocrit  Date Value Ref Range Status  08/15/2023 48.5 37.5 - 51.0 % Final   MCHC  Date Value Ref Range Status  08/15/2023 32.4 31.5 - 35.7 g/dL Final   [facility]  Date Value Ref Range Status  08/15/2023 30.7 26.6 - 33.0 pg Final   MCV  Date Value Ref Range Status  08/15/2023 95 79 - 97 fL Final   No results found for: PLTCOUNTKUC, LABPLAT, POCPLA RDW  Date Value Ref Range Status  08/15/2023 12.8 11.6 - 15.4 % Final         Passed - HBA1C is between 0 and 7.9 and within 180 days    Hemoglobin A1C  Date Value Ref Range Status  06/29/2024 7.1 (A) 4.0 - 5.6 % Final   Hgb A1c MFr Bld  Date Value Ref Range Status  06/29/2024 7.4 (H) 4.8 - 5.6 % Final    Comment:             Prediabetes: 5.7 - 6.4          Diabetes: >6.4          Glycemic control for adults with diabetes: <7.0          Passed - Cr in normal range and within 360 days    Creatinine, Ser  Date Value Ref Range Status  06/29/2024 1.17 0.76 - 1.27 mg/dL Final         Passed - eGFR in normal range and within 360 days    GFR calc Af Amer  Date Value Ref Range Status   05/18/2020 82 >59 mL/min/1.73 Final    Comment:    **[facility] currently reports eGFR in compliance with the current**   recommendations of the [employer]. [facility] will   update reporting as new guidelines are published from the NKF-ASN   Task force.    GFR calc non Af Amer  Date Value Ref Range Status  05/18/2020 71 >59 mL/min/1.73 Final   eGFR  Date Value Ref Range Status  06/29/2024 69 >59 mL/min/1.73 Final         Passed - B12 Level in normal range and within 720 days    Vitamin B-12  Date Value Ref Range Status  06/29/2024 643 232 - 1,245 pg/mL Final         Passed - Valid encounter within last 6 months    Recent Outpatient Visits           2 months ago Type 2 diabetes  mellitus with stage 2 chronic kidney disease, without long-term current use of insulin [facility])   [facility] [place], Lauraine SAILOR, DO   8 months ago Type 2 diabetes mellitus with hyperglycemia, without long-term current use of insulin [facility])   [facility], Lauraine SAILOR, DO

## 2024-09-13 ENCOUNTER — Other Ambulatory Visit: Payer: Self-pay | Admitting: Family Medicine

## 2024-09-13 DIAGNOSIS — E1165 Type 2 diabetes mellitus with hyperglycemia: Secondary | ICD-10-CM

## 2024-09-29 ENCOUNTER — Ambulatory Visit: Admitting: Family Medicine

## 2024-12-27 ENCOUNTER — Encounter

## 2025-01-06 ENCOUNTER — Encounter

## 2025-02-02 ENCOUNTER — Ambulatory Visit: Admitting: Family Medicine
